# Patient Record
Sex: Female | Born: 1970 | Race: White | Hispanic: No | Marital: Married | Smoking: Former smoker
Health system: Southern US, Community
[De-identification: ages and names within clinical notes are randomized; demographics above are authoritative.]

## PROBLEM LIST (undated history)

## (undated) DIAGNOSIS — R112 Nausea with vomiting, unspecified: Secondary | ICD-10-CM

## (undated) DIAGNOSIS — Z87442 Personal history of urinary calculi: Secondary | ICD-10-CM

## (undated) DIAGNOSIS — Z9889 Other specified postprocedural states: Secondary | ICD-10-CM

## (undated) DIAGNOSIS — M1990 Unspecified osteoarthritis, unspecified site: Secondary | ICD-10-CM

## (undated) DIAGNOSIS — N200 Calculus of kidney: Secondary | ICD-10-CM

## (undated) DIAGNOSIS — D649 Anemia, unspecified: Secondary | ICD-10-CM

## (undated) DIAGNOSIS — F419 Anxiety disorder, unspecified: Secondary | ICD-10-CM

## (undated) DIAGNOSIS — G4730 Sleep apnea, unspecified: Secondary | ICD-10-CM

## (undated) HISTORY — DX: Sleep apnea, unspecified: G47.30

---

## 2001-04-10 ENCOUNTER — Other Ambulatory Visit: Admission: RE | Admit: 2001-04-10 | Discharge: 2001-04-10 | Payer: Self-pay | Admitting: Obstetrics and Gynecology

## 2002-05-14 ENCOUNTER — Other Ambulatory Visit: Admission: RE | Admit: 2002-05-14 | Discharge: 2002-05-14 | Payer: Self-pay | Admitting: Obstetrics and Gynecology

## 2003-05-26 ENCOUNTER — Other Ambulatory Visit: Admission: RE | Admit: 2003-05-26 | Discharge: 2003-05-26 | Payer: Self-pay | Admitting: Obstetrics and Gynecology

## 2004-07-04 DIAGNOSIS — Z8632 Personal history of gestational diabetes: Secondary | ICD-10-CM

## 2004-07-04 HISTORY — DX: Personal history of gestational diabetes: Z86.32

## 2005-05-12 ENCOUNTER — Other Ambulatory Visit: Admission: RE | Admit: 2005-05-12 | Discharge: 2005-05-12 | Payer: Self-pay | Admitting: Obstetrics and Gynecology

## 2005-10-03 ENCOUNTER — Encounter: Admission: RE | Admit: 2005-10-03 | Discharge: 2005-10-03 | Payer: Self-pay | Admitting: Obstetrics and Gynecology

## 2005-11-20 ENCOUNTER — Inpatient Hospital Stay (HOSPITAL_COMMUNITY): Admission: AD | Admit: 2005-11-20 | Discharge: 2005-11-23 | Payer: Self-pay | Admitting: Obstetrics and Gynecology

## 2005-11-20 ENCOUNTER — Encounter (INDEPENDENT_AMBULATORY_CARE_PROVIDER_SITE_OTHER): Payer: Self-pay | Admitting: *Deleted

## 2008-07-04 HISTORY — PX: EXTRACORPOREAL SHOCK WAVE LITHOTRIPSY: SHX1557

## 2008-10-18 ENCOUNTER — Emergency Department (HOSPITAL_COMMUNITY): Admission: EM | Admit: 2008-10-18 | Discharge: 2008-10-18 | Payer: Self-pay | Admitting: Emergency Medicine

## 2008-11-01 ENCOUNTER — Emergency Department (HOSPITAL_COMMUNITY): Admission: EM | Admit: 2008-11-01 | Discharge: 2008-11-01 | Payer: Self-pay | Admitting: Emergency Medicine

## 2008-11-03 ENCOUNTER — Inpatient Hospital Stay (HOSPITAL_COMMUNITY): Admission: AD | Admit: 2008-11-03 | Discharge: 2008-11-04 | Payer: Self-pay | Admitting: Urology

## 2008-11-04 HISTORY — PX: OTHER SURGICAL HISTORY: SHX169

## 2008-11-17 ENCOUNTER — Ambulatory Visit (HOSPITAL_COMMUNITY): Admission: RE | Admit: 2008-11-17 | Discharge: 2008-11-17 | Payer: Self-pay | Admitting: Urology

## 2009-02-04 ENCOUNTER — Encounter (HOSPITAL_COMMUNITY): Admission: RE | Admit: 2009-02-04 | Discharge: 2009-04-07 | Payer: Self-pay | Admitting: Internal Medicine

## 2009-02-26 ENCOUNTER — Ambulatory Visit (HOSPITAL_COMMUNITY): Admission: RE | Admit: 2009-02-26 | Discharge: 2009-02-27 | Payer: Self-pay | Admitting: Surgery

## 2009-02-26 ENCOUNTER — Encounter (INDEPENDENT_AMBULATORY_CARE_PROVIDER_SITE_OTHER): Payer: Self-pay | Admitting: Surgery

## 2009-02-26 HISTORY — PX: OTHER SURGICAL HISTORY: SHX169

## 2010-10-09 LAB — BASIC METABOLIC PANEL
CO2: 28 mEq/L (ref 19–32)
Calcium: 10.4 mg/dL (ref 8.4–10.5)
Chloride: 105 mEq/L (ref 96–112)
GFR calc Af Amer: >60 mL/min (ref >60)
Glucose, Bld: 104 mg/dL — ABNORMAL HIGH (ref 70–99)
Potassium: 4.6 mEq/L (ref 3.5–5.1)
Sodium: 140 mEq/L (ref 135–145)

## 2010-10-09 LAB — PROTIME-INR
INR: 1 (ref 0.00–1.49)
Prothrombin Time: 12.9 seconds (ref 11.6–15.2)

## 2010-10-09 LAB — URINALYSIS, ROUTINE W REFLEX MICROSCOPIC
Hgb urine dipstick: NEGATIVE
Ketones, ur: NEGATIVE mg/dL
Protein, ur: NEGATIVE mg/dL
Urobilinogen, UA: 0.2 mg/dL (ref 0.0–1.0)

## 2010-10-09 LAB — DIFFERENTIAL
Eosinophils Absolute: 0.1 10*3/uL (ref 0.0–0.7)
Lymphocytes Relative: 22 % (ref 12–46)
Lymphs Abs: 1.9 10*3/uL (ref 0.7–4.0)
Neutrophils Relative %: 71 % (ref 43–77)

## 2010-10-09 LAB — CBC
MCV: 90.1 fL (ref 78.0–100.0)
Platelets: 222 10*3/uL (ref 150–400)
RBC: 4.6 MIL/uL (ref 3.87–5.11)

## 2010-10-09 LAB — CALCIUM: Calcium: 9.2 mg/dL (ref 8.4–10.5)

## 2010-10-12 LAB — BASIC METABOLIC PANEL
BUN: 8 mg/dL (ref 6–23)
Calcium: 10.4 mg/dL (ref 8.4–10.5)
Creatinine, Ser: 0.81 mg/dL (ref 0.4–1.2)
GFR calc Af Amer: >60 mL/min (ref >60)

## 2010-10-12 LAB — URINALYSIS, ROUTINE W REFLEX MICROSCOPIC
Glucose, UA: NEGATIVE mg/dL
Hgb urine dipstick: NEGATIVE
Protein, ur: NEGATIVE mg/dL
Specific Gravity, Urine: 1.02 (ref 1.005–1.030)
pH: 6 (ref 5.0–8.0)

## 2010-10-12 LAB — CBC
Platelets: 299 10*3/uL (ref 150–400)
RBC: 4.43 MIL/uL (ref 3.87–5.11)
WBC: 9.4 10*3/uL (ref 4.0–10.5)

## 2010-10-12 LAB — PREGNANCY, URINE: Preg Test, Ur: NEGATIVE

## 2010-10-12 LAB — URINE MICROSCOPIC-ADD ON

## 2010-10-13 LAB — POCT PREGNANCY, URINE: Preg Test, Ur: NEGATIVE

## 2010-10-13 LAB — POCT URINALYSIS DIP (DEVICE)
Glucose, UA: NEGATIVE mg/dL
Ketones, ur: NEGATIVE mg/dL
Specific Gravity, Urine: 1.015 (ref 1.005–1.030)
Urobilinogen, UA: 0.2 mg/dL (ref 0.0–1.0)

## 2010-11-16 NOTE — Op Note (Signed)
[name], [name]            ACCOUNT NO.:  [id number]   MEDICAL RECORD NO.:  [id number]          PATIENT TYPE:  AMB   LOCATION:  DAY                          FACILITY:  [facility]   PHYSICIAN:  [name], MD      DATE OF BIRTH:  1971-05-02   DATE OF PROCEDURE:  02/26/2009  DATE OF DISCHARGE:                               OPERATIVE REPORT   PREOPERATIVE DIAGNOSIS:  Primary hyperparathyroidism.   POSTOPERATIVE DIAGNOSIS:  Primary hyperparathyroidism.   PROCEDURE:  Right superior parathyroidectomy.   SURGEON:  [name], MD, FACS   ASSISTANT:  Anselm Pancoast. Zachery Dakins, MD, FACS   ANESTHESIA:  General.   ESTIMATED BLOOD LOSS:  Minimal.   PREPARATION:  ChloraPrep.   COMPLICATIONS:  None.   INDICATIONS:  The patient is a 40 year old white female from [place],  [place].  She has undergone workup by Dr. [name] for  primary hyperparathyroidism.  She has a history of nephrolithiasis  treated by Dr. [name].  The patient had serum calcium levels  ranging from 10.9 to 11.3.  Intact PTH level was elevated at 74.  A 24-  hour urine collection for calcium was elevated at 368.  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  The patient now comes to surgery  for resection.   BODY OF REPORT:  Procedure was done in OR #11 at the [facility].  The patient was brought to the operating room,  placed in supine position on the operating room table.  Following  administration of general anesthesia, the patient is positioned and then  prepped and draped in the usual strict aseptic fashion.  After  ascertaining that an adequate level of anesthesia had been achieved, an  inferior right neck incision was made with a #15 blade.  Dissection was  carried down through subcutaneous tissues and platysma.  Hemostasis was  obtained with electrocautery.  Subplatysmal flaps were developed  circumferentially.  Weitlaner retractor was placed for exposure.   Strap  muscles were incised in the midline and reflected to the right.  Inferior pole of the thyroid gland is exposed.  Dissection in the  tracheoesophageal groove reveals an abnormally enlarged parathyroid  gland extending from just above the inferior thyroid artery cephalad.  No inferior gland was identified.  Recurrent nerve was identified and  preserved.  Parathyroid gland is dissected out.  It measures  approximately 2 cm in length.  Vascular tributaries were divided between  small Ligaclips under direct vision.  Gland is gently dissected out and  excised in its entirety.  It is submitted to pathology where Dr. [name] states that it is indeed parathyroid tissue.  It is enlarged,  weighing 1000 mg.   Good hemostasis was noted in the neck.  Neck is irrigated.  Surgicel was  placed in the operative field.  Strap muscles were reapproximated in the  midline with interrupted 3-0 Vicryl sutures.  Platysma was closed with  interrupted 3-0 Vicryl sutures.  Local field block was placed with  Marcaine.  Skin was closed with a running 4-0 Monocryl subcuticular  suture.  Wound is washed and dried and Benzoin and Steri-Strips were  applied.  Sterile dressings were applied.  The patient is awakened from  anesthesia and brought to the recovery room.  The patient tolerated the  procedure well.      [name], MD  Electronically Signed     TMG/MEDQ  D:  02/26/2009  T:  02/26/2009  Job:  [id number]   cc:   Tonita Cong, M.D.   Sigmund I. Patsi Sears, M.D.  Fax: [phone]   Sedonia Small, NP  [facility], MD  [phone] [address] [place]  [place] [address]

## 2010-11-16 NOTE — Op Note (Signed)
Emily Berg, Emily Berg            ACCOUNT NO.:  [id number]   MEDICAL RECORD NO.:  [id number]          PATIENT TYPE:  INP   LOCATION:  1408                         FACILITY:  [facility]   PHYSICIAN:  Sigmund I. Patsi Sears, M.D.DATE OF BIRTH:  May 05, 1971   DATE OF PROCEDURE:  11/04/2008  DATE OF DISCHARGE:                               OPERATIVE REPORT   PREOPERATIVE DIAGNOSIS:  Left renal pelvic stone.   POSTOPERATIVE DIAGNOSIS:  Left renal pelvic stone.   OPERATION:  Cystourethroscopy, left retrograde pyelogram, left double-J  stent (5-French x 24 cm).   ANESTHESIA:  General LMA.   PREPARATION:  After appropriate preanesthesia, the patient was brought  to the operating room and placed on the operating room in the dorsal  supine position where general LMA anesthesia was induced.  She was then  replaced in the dorsal lithotomy position where the pubis was prepped  with Betadine solution and draped in the usual fashion.   REVIEW OF HISTORY:  This 40 year old female was admitted yesterday from  the office with one week of left flank pain, left lower quadrant pain,  nausea, and vomiting.  CT scan showed a 7-mm left UPJ stone.  She has  had repeat CT showing stone at the UPJ.  She has had recurrent nausea  and vomiting this morning and is for stent placement.   DESCRIPTION OF PROCEDURE:  Cystourethroscopy was accomplished, left  retrograde pyelogram was accomplished, and left double-J catheter was  placed without difficulty.  The patient was given IV Toradol, awakened,  and taken to the recovery room in good condition.      Sigmund I. Patsi Sears, M.D.  Electronically Signed     SIT/MEDQ  D:  11/04/2008  T:  11/04/2008  Job:  [id number]

## 2010-11-18 ENCOUNTER — Emergency Department (HOSPITAL_COMMUNITY)
Admission: EM | Admit: 2010-11-18 | Discharge: 2010-11-18 | Disposition: A | Discharge status: Home / Self Care | Payer: BC Managed Care – PPO | Attending: Emergency Medicine | Admitting: Emergency Medicine

## 2010-11-18 DIAGNOSIS — F411 Generalized anxiety disorder: Secondary | ICD-10-CM | POA: Insufficient documentation

## 2010-11-18 DIAGNOSIS — F429 Obsessive-compulsive disorder, unspecified: Secondary | ICD-10-CM | POA: Insufficient documentation

## 2010-11-18 DIAGNOSIS — R112 Nausea with vomiting, unspecified: Secondary | ICD-10-CM | POA: Insufficient documentation

## 2010-11-18 DIAGNOSIS — Z79899 Other long term (current) drug therapy: Secondary | ICD-10-CM | POA: Insufficient documentation

## 2010-11-18 DIAGNOSIS — R42 Dizziness and giddiness: Secondary | ICD-10-CM | POA: Insufficient documentation

## 2010-11-19 LAB — POCT I-STAT, CHEM 8
BUN: 9 mg/dL (ref 6–23)
Creatinine, Ser: 1 mg/dL (ref 0.4–1.2)
Hemoglobin: 16 g/dL — ABNORMAL HIGH (ref 12.0–15.0)
Potassium: 3.8 mEq/L (ref 3.5–5.1)
Sodium: 141 mEq/L (ref 135–145)
TCO2: 25 mmol/L (ref 0–100)

## 2010-11-19 NOTE — Op Note (Signed)
[name], Emily Berg            ACCOUNT NO.:  [id number]   MEDICAL RECORD NO.:  [id number]          PATIENT TYPE:  INP   LOCATION:  9108                          FACILITY:  [facility]   PHYSICIAN:  Carrington Clamp, M.D. DATE OF BIRTH:  [birth year]   DATE OF PROCEDURE:  11/20/2005  DATE OF DISCHARGE:                                 OPERATIVE REPORT   PREOPERATIVE DIAGNOSES:  1.  Bleeding.  2.  Non-reassuring fetal heart tones.   POSTOPERATIVE DIAGNOSES:  1.  Probable abruption.  2.  Breech presentation.   PROCEDURE:  Cesarean section with T incision, essentially classical  extension.   SURGEON:  Carrington Clamp, M.D.   ASSISTANT:  None.   ANESTHESIA:  Spinal.   SPECIMENS:  Placenta.   ESTIMATED BLOOD LOSS:  500.   INTRAVENOUS FLUIDS:  2400   URINE OUTPUT:  250.   COMPLICATIONS:  Baby in the footling breech presentation and a tight band of  uterus in the lower uterine segment.  The baby was unable to be delivered  through a transverse incision, even though it was extended bilaterally.  The  incision had to T'd, therefore a classical incision was made on the uterus  as well.   FINDINGS:  A female infant, footling breech, Apgar scores 7 and 9.  Tight  lower uterine segment band and underdeveloped lower uterine segment.  Normal  tubes and ovaries were otherwise seen.   MEDICATIONS:  Pitocin and Ancef.   COUNTS:  Correct x3.   REASON FOR OPERATION:  Emily Berg complained of rupture of membranes at  1:55, clear fluid this evening and was instructed to come to the hospital.  On arrival at the hospital, she had gone to the bathroom and told the ladies  up front afterwards that she had a significant amount of bleeding.  She was  taken directly to the Maternity Admissions Unit, undressed and baby was  placed on the monitor.  There was a significant amount of bleeding coming  from the vagina mixed with fluid.  The baby was tachycardiac with 3, what  appeared to be late  decelerations down to the 60s for about a minute.  I was  called and arrived at the hospital within 5-6 minutes.  I evaluated the  strip and checked the patient's cervix, which was 2, long and high.  There  was a significant amount of bloody fluid coming from the vagina.  The  decision was made to do an emergent cesarean section secondary to probable  abruption.  The patient was counseled of the risks, benefits and  alternatives and taken to the operating room in a very timely fashion.   TECHNIQUE:  Once spinal anesthesia was achieved, the patient was prepped and  draped in the usual sterile fashion in the dorsal supine position with a  leftward tilt.  A Pfannenstiel skin incision was made with a scalpel and  carried down to fascia with the Bovie cautery.  The fascia was incised in  the midline with a scalpel and extended in a transverse curvilinear manner  with Mayo scissors.  The fascia was reflected superiorly and  inferiorly from  the rectus muscles and the rectus muscles split in the midline.  The  peritoneum was entered into bluntly and the bladder blade placed.  The  vesicouterine fascia was tented up and incised in a transverse curvilinear  manner and the bladder flap created bluntly.  The bladder blade was replaced  and a 2-cm incision was made transversely in the lower uterine segment.  This was then extended with the bandage scissors and the placenta was noted  to be anterior.   Once inside the amniotic cavity, the baby was identified in the footling  breech presentation and the feet were able to be grasped and pulled through  the incision.  However, the incision proved to be not large enough for the  buttocks to be able to be delivered and the incision was extended  transversely.  Again, there was still not enough room, as there was no  development of the lower uterine segment and there was a tight uterine band  just above the incision.  The Mayo scissors were then used to T  the incision  in a vertical fashion.  The cut was made about 6-7 cm.  The baby was then  able to be delivered in the usual fashion without complication.  The cord  was clamped and cut and the baby was handed to waiting Pediatrics.   The placenta was then delivered and it appeared that it was a possible  abruption secondary to some clot on the surface of the placenta, as well as  the fact that it was not very adherent.  An attempt was made to obtain the  cord gas; however, insufficient blood was able to be obtained.  The  transverse incision in the uterus was then closed with a running locked lock  stitch of 0 Monocryl.  There was a bleeding artery in the right leaf of the  broad ligament, just lateral to the upper portion of the lower uterine  segment.  This could not be closed with the suture that was closing the  incision and therefore a mattress-like suture was performed through the  right side of the cervix and back through the broad ligament close to the  cervix and tied down.  This was well-away from the ureter, as this was at  the level of the internal os and medial to those structures.   The vertical incision was then closed with a running lock stitch of 0  Monocryl.  Two additional imbricating layers of 0 Monocryl were performed to  the vertical incision and 1 imbricating layer for the horizontal incision.  Several figure-of-eight stitches were used to ensure hemostasis.  Once  hemostasis was achieved, the uterus was reapproximated in the abdomen and  the abdomen cleared of all debris with irrigation.  The uterine incisions  were then reinspected and found to be hemostatic.   The peritoneum was then closed with a running stitch of 2-0 Vicryl.  The  fascia was closed with a running stitch of 0 Vicryl.  The subcutaneous  tissue were rendered hemostatic with the Bovie cautery and irrigation.  The subcutaneous tissue was closed with 3 interrupted stitches of 2-0 plain gut.  The  skin was closed with staples.  The patient was  instructed on the operating table both during the surgery and immediately  afterwards that she had essentially a classical cesarean section scar and  would not be able to labor ever again.  The patient understood and was  returned to recovery  room in stable condition.      Carrington Clamp, M.D.  Electronically Signed     MH/MEDQ  D:  11/20/2005  T:  11/21/2005  Job:  [id number]

## 2010-11-19 NOTE — Discharge Summary (Signed)
Emily Berg, Emily Berg            ACCOUNT NO.:  [id number]   MEDICAL RECORD NO.:  [id number]          PATIENT TYPE:  INP   LOCATION:  9108                          FACILITY:  [facility]   PHYSICIAN:  Gerrit Friends. Aldona Bar, M.D.   DATE OF BIRTH:  [birth year]   DATE OF ADMISSION:  11/20/2005  DATE OF DISCHARGE:  11/23/2005                                 DISCHARGE SUMMARY   DISCHARGE DIAGNOSES:  1.  Thirty-six week intrauterine pregnancy, delivered of a 5 pound female      infant, Apgars 7/9.  2.  Blood type O positive.  3.  Suspected abruption of placenta.  4.  Cesarean section - classical extension of a low transverse cesarean      section.   SUMMARY:  This 40 year old gravida 3, para 1 was admitted at [redacted] weeks  gestation with bleeding, contractions, and on the monitor was having late  decelerations of the fetal heart rate.  Abruption was suspected, and she was  taken to the operating room for emergency cesarean section.  Intraoperative,  the Pfannenstiel incision was extended superiorly in an inverted T fashion -  almost like a classical cesarean section, and she was delivered of a 5 pound  female infant with good Apgars.  The postoperative course was benign.  Discharge hemoglobin was 11.4 with a white count of 13,300 and a platelet  count of 241,000.  On the morning of Nov 23, 2005, she was ambulating well,  tolerating a regular diet well, had normal bowel and bladder function, was  bottle feeding without difficulty.  Her incision was clean and dry, and her  vital signs were stable, and she was deemed ready for discharge.  Accordingly, all her staples were removed, and her wound was Steri-stripped  appropriately.  She was given all appropriate instructions and understood  all instructions well.   DISCHARGE MEDICATIONS:  1.  Vitamins - one a day.  She will finish them up, as she is bottle      feeding.  2.  She will use Feosol capsules, one 2-3 times a week.  3.  She was given a prescription for  Motrin 600 mg and for Tylox to use      respectively for pain as needed.   FOLLOW UP:  She will return to the office for follow up in approximately 2  weeks to see Dr. Henderson Cloud.  She understood all instructions well at the time  of discharge.   CONDITION ON DISCHARGE:  Improved.      Gerrit Friends. Aldona Bar, M.D.  Electronically Signed     RMW/MEDQ  D:  11/23/2005  T:  11/23/2005  Job:  [id number]

## 2013-07-30 ENCOUNTER — Other Ambulatory Visit: Payer: Self-pay | Admitting: Obstetrics and Gynecology

## 2013-07-30 DIAGNOSIS — N6321 Unspecified lump in the left breast, upper outer quadrant: Secondary | ICD-10-CM

## 2013-08-02 ENCOUNTER — Ambulatory Visit
Admission: RE | Admit: 2013-08-02 | Discharge: 2013-08-02 | Disposition: A | Discharge status: Home / Self Care | Payer: Self-pay | Source: Ambulatory Visit | Attending: Obstetrics and Gynecology | Admitting: Obstetrics and Gynecology

## 2013-08-02 ENCOUNTER — Other Ambulatory Visit: Payer: Self-pay | Admitting: Obstetrics and Gynecology

## 2013-08-02 ENCOUNTER — Ambulatory Visit
Admission: RE | Admit: 2013-08-02 | Discharge: 2013-08-02 | Disposition: A | Discharge status: Home / Self Care | Payer: BC Managed Care – PPO | Source: Ambulatory Visit | Attending: Obstetrics and Gynecology | Admitting: Obstetrics and Gynecology

## 2013-08-02 DIAGNOSIS — N6321 Unspecified lump in the left breast, upper outer quadrant: Secondary | ICD-10-CM

## 2013-08-02 DIAGNOSIS — N63 Unspecified lump in unspecified breast: Secondary | ICD-10-CM

## 2014-02-11 ENCOUNTER — Other Ambulatory Visit: Payer: Self-pay | Admitting: Obstetrics and Gynecology

## 2014-02-11 DIAGNOSIS — N6320 Unspecified lump in the left breast, unspecified quadrant: Secondary | ICD-10-CM

## 2014-02-20 ENCOUNTER — Other Ambulatory Visit: Payer: Self-pay | Admitting: Obstetrics and Gynecology

## 2014-02-20 ENCOUNTER — Ambulatory Visit
Admission: RE | Admit: 2014-02-20 | Discharge: 2014-02-20 | Disposition: A | Discharge status: Home / Self Care | Payer: PRIVATE HEALTH INSURANCE | Source: Ambulatory Visit | Attending: Obstetrics and Gynecology | Admitting: Obstetrics and Gynecology

## 2014-02-20 ENCOUNTER — Ambulatory Visit: Payer: PRIVATE HEALTH INSURANCE

## 2014-02-20 DIAGNOSIS — N6320 Unspecified lump in the left breast, unspecified quadrant: Secondary | ICD-10-CM

## 2014-07-10 ENCOUNTER — Ambulatory Visit
Admission: RE | Admit: 2014-07-10 | Discharge: 2014-07-10 | Disposition: A | Discharge status: Home / Self Care | Payer: PRIVATE HEALTH INSURANCE | Source: Ambulatory Visit | Attending: Family | Admitting: Family

## 2014-07-10 ENCOUNTER — Other Ambulatory Visit: Payer: Self-pay | Admitting: Family

## 2014-07-10 DIAGNOSIS — R109 Unspecified abdominal pain: Secondary | ICD-10-CM

## 2014-07-11 ENCOUNTER — Other Ambulatory Visit: Payer: Self-pay | Admitting: Urology

## 2014-07-11 ENCOUNTER — Encounter (HOSPITAL_BASED_OUTPATIENT_CLINIC_OR_DEPARTMENT_OTHER): Payer: Self-pay | Admitting: *Deleted

## 2014-07-14 ENCOUNTER — Encounter (HOSPITAL_BASED_OUTPATIENT_CLINIC_OR_DEPARTMENT_OTHER): Payer: Self-pay | Admitting: *Deleted

## 2014-07-14 ENCOUNTER — Other Ambulatory Visit: Payer: Self-pay | Admitting: Urology

## 2014-07-14 NOTE — Progress Notes (Signed)
NPO AFTER MN. ARRIVE AT 0900. NEEDS HG AND URINE PREG. MAY TAKE HYDROCODONE IF NEEDED AM DOS W/ SIPS OF WATER.

## 2014-07-15 ENCOUNTER — Encounter (HOSPITAL_COMMUNITY): Payer: Self-pay | Admitting: *Deleted

## 2014-07-15 NOTE — H&P (Signed)
History of Present Illness   Emily Berg presents today as a more urgent walk-in with a newly diagnosed right renal pelvic stone. Tia is currently 44 years of age. She required intervention for a left proximal ureteral stone back in 2010. She was acutely ill and required urgent stent placement secondary to ongoing pain and nausea followed by lithotripsy. That procedure worked well. At that time, she was known to have a 3 mm stone in her right renal unit. She was diagnosed with hypothyroidism and underwent a resection of a parathyroid adenoma by Dr Armandina Gemma. Followup studies indicated that her calcium level normalized, but she not had a reassessment in quite some time. Clinically, she has done well but starting approximately 6 weeks ago, she began experiencing some intermittent right-sided flank/back discomfort. She recently underwent CT imaging of her abdomen and pelvis without contrast. This demonstrated a 12 mm x 15 mm stone in her right renal pelvis. There did not appear to be evidence of any high grade obstruction. There was also a very tiny 1 mm stone in the lower pole calyx. No left-sided renal calculi were noted. She has had some intermittent ongoing mild discomfort. She has been given a course of ciprofloxacin and also some hydrocodone. Her urinalysis today shows a small degree of pyuria. There is no significant hematuria today. I have reviewed her imaging studies. I have reviewed her previous records. Previous stones analysis was a mixture of calcium oxalate and calcium phosphate. It did appear that her stone fractured well with previous lithotripsy.      Past Medical History Problems  1. History of anemia (Z86.2) 2. History of esophageal reflux (Z87.19)  Surgical History Problems  1. History of Cesarean Section 2. History of Cystoscopy With Insertion Of Ureteral Stent Left 3. History of Lithotripsy  Current Meds 1. Cipro 500 MG Oral Tablet;  Therapy: (Recorded:08Jan2016) to  Recorded 2. Hydrocodone-Acetaminophen 5-325 MG Oral Tablet;  Therapy: (Recorded:08Jan2016) to Recorded 3. Junel Fe 24 TABS;  Therapy: (Recorded:08Jan2016) to Recorded  Allergies Medication  1. No Known Drug Allergies  Family History Problems  1. Family history of Diabetes Mellitus 2. Family history of Family Health Status - Father's Age   65 3. Family history of Family Health Status - Mother's Age   11 4. Family history of Family Health Status Number Of Children   1 son, 1 daughter 5. Family history of Nephrolithiasis : Father  Social History Problems    Denied: History of Alcohol Use   Caffeine Use   4 per day   Former Smoker   2 ppd x 13yrs, quit 60yrs ago   Marital History - Currently Married  Review of Systems Genitourinary, constitutional, skin, eye, otolaryngeal, hematologic/lymphatic, cardiovascular, pulmonary, endocrine, musculoskeletal, gastrointestinal, neurological and psychiatric system(s) were reviewed and pertinent findings if present are noted and are otherwise negative.  Gastrointestinal: nausea and flank pain, but no abdominal pain.    Vitals Vital Signs [Data Includes: Last 1 Day]  Recorded: [id number] 12:59PM  Height: 5 ft 6 in Weight: 214 lb  BMI Calculated: 34.54 BSA Calculated: 2.06 Blood Pressure: 138 / 89 Temperature: 99.3 F Heart Rate: 81  Physical Exam Constitutional: Well nourished and well developed . No acute distress.  ENT:. The ears and nose are normal in appearance.  Neck: The appearance of the neck is normal and no neck mass is present.  Pulmonary: No respiratory distress and normal respiratory rhythm and effort.  Cardiovascular: Heart rate and rhythm are normal . No peripheral edema.  Abdomen:  The abdomen is soft and nontender. No masses are palpated. No CVA tenderness. No hernias are palpable. No hepatosplenomegaly noted.    Results/Data Urine [Data Includes: Last 1 Day]   [id number]  COLOR YELLOW   APPEARANCE CLEAR    SPECIFIC GRAVITY 1.025   pH 5.5   GLUCOSE 250 mg/dL  BILIRUBIN NEG   KETONE NEG mg/dL  BLOOD TRACE   PROTEIN NEG mg/dL  UROBILINOGEN 0.2 mg/dL  NITRITE NEG   LEUKOCYTE ESTERASE SMALL   SQUAMOUS EPITHELIAL/HPF MODERATE   WBC 11-20 WBC/hpf  RBC 0-2 RBC/hpf  BACTERIA FEW   CRYSTALS NONE SEEN   CASTS NONE SEEN    Assessment Assessed  1. Nephrolithiasis (N20.0) 2. Pyuria (N39.0)  Plan Health Maintenance  1. UA With REFLEX; [Do Not Release]; Status:Complete;   Done: [id number] 12:32PM Nephrolithiasis  2. BASIC METABOLIC PANEL; Status:Hold For - Specimen/Data Collection,Appointment;  Requested EUM:[id number];  3. PTH INTACT with CALCIUM; Status:Hold For - Specimen/Data Collection,Appointment;  Requested VQM:[id number];  4. Follow-up Schedule Surgery Office  Follow-up  Status: Hold For - Appointment   Requested for: [id number]  Discussion/Summary   Shanaiya has a fairly large 15 mm fairly large stone in her right renal pelvis. It is currently not causing a significant degree of obstruction, but undoubtedly has been ball valving in and out of her ureteropelvic junction causing intermittent pain. Her urinalysis does show some pyuria, which I suspect is secondary to inflammation rather than infection. She did not have obvious pyelonephritis. I have asked her to reduce her ciprofloxacin dose to 250 twice a day while we are awaiting her culture results. This stone can be treated a variety of ways. Clearly, intervention is indicated. Percutaneous nephrolithotomy could be performed, but I think that would over aggressive therapy. My only concern with ESWL is the ability to pass all of those fragments. We talked about several options and have decided to proceed with double-J stent placement to reduce the risk of obstructing fragments followed by ESWL. The stent should allow for better ureteral dilation and an improved chance of passing the stone pieces spontaneously. She understands there is a  chance she will require multiple lithotripsy's or ureteroscopy to remove all of the fragments. We will try to set up something for the next week or so. She does have adequate pain medication at this time.   cc: Sadie Haber and [facility]   Signatures Electronically signed by : Rana Snare, M.D.; Jul 11 2014  4:04PM EST

## 2014-07-16 ENCOUNTER — Ambulatory Visit (HOSPITAL_BASED_OUTPATIENT_CLINIC_OR_DEPARTMENT_OTHER): Payer: PRIVATE HEALTH INSURANCE | Admitting: Anesthesiology

## 2014-07-16 ENCOUNTER — Encounter (HOSPITAL_BASED_OUTPATIENT_CLINIC_OR_DEPARTMENT_OTHER): Payer: Self-pay | Admitting: Anesthesiology

## 2014-07-16 ENCOUNTER — Encounter (HOSPITAL_BASED_OUTPATIENT_CLINIC_OR_DEPARTMENT_OTHER)
Admission: RE | Disposition: A | Discharge status: Home / Self Care | Payer: Self-pay | Source: Ambulatory Visit | Attending: Urology

## 2014-07-16 ENCOUNTER — Ambulatory Visit (HOSPITAL_BASED_OUTPATIENT_CLINIC_OR_DEPARTMENT_OTHER)
Admission: RE | Admit: 2014-07-16 | Discharge: 2014-07-16 | Disposition: A | Discharge status: Home / Self Care | Payer: PRIVATE HEALTH INSURANCE | Source: Ambulatory Visit | Attending: Urology | Admitting: Urology

## 2014-07-16 DIAGNOSIS — Z87891 Personal history of nicotine dependence: Secondary | ICD-10-CM | POA: Diagnosis not present

## 2014-07-16 DIAGNOSIS — E039 Hypothyroidism, unspecified: Secondary | ICD-10-CM | POA: Insufficient documentation

## 2014-07-16 DIAGNOSIS — D649 Anemia, unspecified: Secondary | ICD-10-CM | POA: Diagnosis not present

## 2014-07-16 DIAGNOSIS — N200 Calculus of kidney: Secondary | ICD-10-CM | POA: Diagnosis not present

## 2014-07-16 DIAGNOSIS — K219 Gastro-esophageal reflux disease without esophagitis: Secondary | ICD-10-CM | POA: Insufficient documentation

## 2014-07-16 DIAGNOSIS — D351 Benign neoplasm of parathyroid gland: Secondary | ICD-10-CM | POA: Diagnosis not present

## 2014-07-16 DIAGNOSIS — N390 Urinary tract infection, site not specified: Secondary | ICD-10-CM | POA: Insufficient documentation

## 2014-07-16 HISTORY — PX: CYSTOSCOPY W/ URETERAL STENT PLACEMENT: SHX1429

## 2014-07-16 HISTORY — DX: Nausea with vomiting, unspecified: R11.2

## 2014-07-16 HISTORY — DX: Personal history of urinary calculi: Z87.442

## 2014-07-16 HISTORY — DX: Other specified postprocedural states: Z98.890

## 2014-07-16 HISTORY — DX: Calculus of kidney: N20.0

## 2014-07-16 LAB — POCT HEMOGLOBIN-HEMACUE: HEMOGLOBIN: 14.3 g/dL (ref 12.0–15.0)

## 2014-07-16 LAB — POCT PREGNANCY, URINE: PREG TEST UR: NEGATIVE

## 2014-07-16 SURGERY — CYSTOSCOPY, WITH RETROGRADE PYELOGRAM AND URETERAL STENT INSERTION
Anesthesia: General | Site: Ureter | Laterality: Right

## 2014-07-16 MED ORDER — SODIUM CHLORIDE 0.9 % IR SOLN
Status: DC | PRN
  Administered 2014-07-16: 4000 mL via INTRAVESICAL

## 2014-07-16 MED ORDER — LIDOCAINE HCL 2 % EX GEL
CUTANEOUS | Status: DC | PRN
  Administered 2014-07-16: 1 via URETHRAL

## 2014-07-16 MED ORDER — SCOPOLAMINE 1 MG/3DAYS TD PT72
MEDICATED_PATCH | TRANSDERMAL | Status: AC
  Filled 2014-07-16: qty 1

## 2014-07-16 MED ORDER — SCOPOLAMINE 1 MG/3DAYS TD PT72
MEDICATED_PATCH | TRANSDERMAL | Status: DC | PRN
  Administered 2014-07-16: 1 via TRANSDERMAL

## 2014-07-16 MED ORDER — URIBEL 118 MG PO CAPS: 1.0000 | ORAL_CAPSULE | Freq: Three times a day (TID) | ORAL | Status: DC | PRN

## 2014-07-16 MED ORDER — LACTATED RINGERS IV SOLN
INTRAVENOUS | Status: DC
  Administered 2014-07-16: 10:00:00 via INTRAVENOUS
  Filled 2014-07-16: qty 1000

## 2014-07-16 MED ORDER — CEFAZOLIN SODIUM-DEXTROSE 2-3 GM-% IV SOLR
INTRAVENOUS | Status: AC
  Filled 2014-07-16: qty 50

## 2014-07-16 MED ORDER — FENTANYL CITRATE 0.05 MG/ML IJ SOLN
25.0000 ug | INTRAMUSCULAR | Status: DC | PRN
  Filled 2014-07-16: qty 1

## 2014-07-16 MED ORDER — CEFAZOLIN SODIUM 1-5 GM-% IV SOLN
1.0000 g | INTRAVENOUS | Status: DC
  Filled 2014-07-16: qty 50

## 2014-07-16 MED ORDER — CEFAZOLIN SODIUM-DEXTROSE 2-3 GM-% IV SOLR
2.0000 g | INTRAVENOUS | Status: AC
  Administered 2014-07-16: 2 g via INTRAVENOUS
  Filled 2014-07-16: qty 50

## 2014-07-16 MED ORDER — ACETAMINOPHEN 10 MG/ML IV SOLN
INTRAVENOUS | Status: DC | PRN
  Administered 2014-07-16: 1000 mg via INTRAVENOUS

## 2014-07-16 MED ORDER — MIDAZOLAM HCL 2 MG/2ML IJ SOLN
INTRAMUSCULAR | Status: AC
  Filled 2014-07-16: qty 2

## 2014-07-16 MED ORDER — LACTATED RINGERS IV SOLN
INTRAVENOUS | Status: DC
  Filled 2014-07-16: qty 1000

## 2014-07-16 MED ORDER — PROPOFOL 10 MG/ML IV BOLUS
INTRAVENOUS | Status: DC | PRN
  Administered 2014-07-16: 200 mg via INTRAVENOUS

## 2014-07-16 MED ORDER — DEXAMETHASONE SODIUM PHOSPHATE 4 MG/ML IJ SOLN
INTRAMUSCULAR | Status: DC | PRN
  Administered 2014-07-16: 10 mg via INTRAVENOUS

## 2014-07-16 MED ORDER — LIDOCAINE HCL (CARDIAC) 20 MG/ML IV SOLN
INTRAVENOUS | Status: DC | PRN
  Administered 2014-07-16: 60 mg via INTRAVENOUS

## 2014-07-16 MED ORDER — IOHEXOL 350 MG/ML SOLN
INTRAVENOUS | Status: DC | PRN
  Administered 2014-07-16: 4 mL

## 2014-07-16 MED ORDER — FENTANYL CITRATE 0.05 MG/ML IJ SOLN
INTRAMUSCULAR | Status: DC | PRN
  Administered 2014-07-16 (×2): 50 ug via INTRAVENOUS

## 2014-07-16 MED ORDER — ONDANSETRON HCL 4 MG/2ML IJ SOLN
INTRAMUSCULAR | Status: DC | PRN
  Administered 2014-07-16: 4 mg via INTRAVENOUS

## 2014-07-16 MED ORDER — METOCLOPRAMIDE HCL 5 MG/ML IJ SOLN
INTRAMUSCULAR | Status: DC | PRN
  Administered 2014-07-16: 10 mg via INTRAVENOUS

## 2014-07-16 MED ORDER — FENTANYL CITRATE 0.05 MG/ML IJ SOLN
INTRAMUSCULAR | Status: AC
  Filled 2014-07-16: qty 4

## 2014-07-16 MED ORDER — MIDAZOLAM HCL 5 MG/5ML IJ SOLN
INTRAMUSCULAR | Status: DC | PRN
  Administered 2014-07-16: 2 mg via INTRAVENOUS

## 2014-07-16 SURGICAL SUPPLY — 31 items
ADAPTER CATH URET PLST 4-6FR (CATHETERS) IMPLANT
BAG DRAIN URO-CYSTO SKYTR STRL (DRAIN) ×2 IMPLANT
BENZOIN TINCTURE PRP APPL 2/3 (GAUZE/BANDAGES/DRESSINGS) IMPLANT
CANISTER SUCT LVC 12 LTR MEDI- (MISCELLANEOUS) ×2 IMPLANT
CATH INTERMIT  6FR 70CM (CATHETERS) ×2 IMPLANT
CATH URET 5FR 28IN CONE TIP (BALLOONS)
CATH URET 5FR 28IN OPEN ENDED (CATHETERS) IMPLANT
CATH URET 5FR 70CM CONE TIP (BALLOONS) IMPLANT
CLOTH BEACON ORANGE TIMEOUT ST (SAFETY) ×2 IMPLANT
DRAPE CAMERA CLOSED 9X96 (DRAPES) ×2 IMPLANT
DRSG TEGADERM 2-3/8X2-3/4 SM (GAUZE/BANDAGES/DRESSINGS) IMPLANT
GLOVE BIO SURGEON STRL SZ7.5 (GLOVE) ×2 IMPLANT
GLOVE BIOGEL M 6.5 STRL (GLOVE) ×2 IMPLANT
GLOVE BIOGEL PI IND STRL 6.5 (GLOVE) ×2 IMPLANT
GLOVE BIOGEL PI INDICATOR 6.5 (GLOVE) ×2
GOWN STRL REIN XL XLG (GOWN DISPOSABLE) IMPLANT
GOWN STRL REUS W/TWL LRG LVL3 (GOWN DISPOSABLE) ×2 IMPLANT
GOWN STRL REUS W/TWL XL LVL3 (GOWN DISPOSABLE) ×2 IMPLANT
GUIDEWIRE 0.038 PTFE COATED (WIRE) IMPLANT
GUIDEWIRE ANG ZIPWIRE 038X150 (WIRE) IMPLANT
GUIDEWIRE STR DUAL SENSOR (WIRE) ×2 IMPLANT
IV NS 1000ML (IV SOLUTION) ×1
IV NS 1000ML BAXH (IV SOLUTION) ×1 IMPLANT
IV NS IRRIG 3000ML ARTHROMATIC (IV SOLUTION) ×4 IMPLANT
KIT BALLIN UROMAX 15FX10 (LABEL) IMPLANT
KIT BALLN UROMAX 15FX4 (MISCELLANEOUS) IMPLANT
KIT BALLN UROMAX 26 75X4 (MISCELLANEOUS)
NS IRRIG 500ML POUR BTL (IV SOLUTION) IMPLANT
PACK CYSTO (CUSTOM PROCEDURE TRAY) ×2 IMPLANT
SET HIGH PRES BAL DIL (LABEL)
STENT URET 6FRX24 CONTOUR (STENTS) ×2 IMPLANT

## 2014-07-16 NOTE — Anesthesia Preprocedure Evaluation (Addendum)
Anesthesia Evaluation  Patient identified by MRN, date of birth, ID band Patient awake    Reviewed: Allergy & Precautions, H&P , NPO status , Patient's Chart, lab work & pertinent test results  History of Anesthesia Complications (+) PONV  Airway Mallampati: II  TM Distance: >3 FB Neck ROM: full    Dental no notable dental hx. (+) Teeth Intact, Dental Advisory Given   Pulmonary neg pulmonary ROS, former smoker,  breath sounds clear to auscultation  Pulmonary exam normal       Cardiovascular Exercise Tolerance: Good negative cardio ROS  Rhythm:regular Rate:Normal     Neuro/Psych negative neurological ROS  negative psych ROS   GI/Hepatic negative GI ROS, Neg liver ROS,   Endo/Other  negative endocrine ROS  Renal/GU negative Renal ROS  negative genitourinary   Musculoskeletal   Abdominal   Peds  Hematology negative hematology ROS (+)   Anesthesia Other Findings   Reproductive/Obstetrics negative OB ROS                            Anesthesia Physical Anesthesia Plan  ASA: I  Anesthesia Plan: General   Post-op Pain Management:    Induction: Intravenous  Airway Management Planned: LMA  Additional Equipment:   Intra-op Plan:   Post-operative Plan:   Informed Consent: I have reviewed the patients History and Physical, chart, labs and discussed the procedure including the risks, benefits and alternatives for the proposed anesthesia with the patient or authorized representative who has indicated his/her understanding and acceptance.   Dental Advisory Given  Plan Discussed with: CRNA and Surgeon  Anesthesia Plan Comments:         Anesthesia Quick Evaluation

## 2014-07-16 NOTE — Anesthesia Postprocedure Evaluation (Signed)
  Anesthesia Post-op Note  Patient: Emily Berg  Procedure(s) Performed: Procedure(s) (LRB): CYSTOSCOPY WITH RETROGRADE PYELOGRAM/URETERAL STENT PLACEMENT (Right)  Patient Location: PACU  Anesthesia Type: General  Level of Consciousness: awake and alert   Airway and Oxygen Therapy: Patient Spontanous Breathing  Post-op Pain: mild  Post-op Assessment: Post-op Vital signs reviewed, Patient's Cardiovascular Status Stable, Respiratory Function Stable, Patent Airway and No signs of Nausea or vomiting  Last Vitals:  Filed Vitals:   07/16/14 1145  BP: 141/91  Pulse: 82  Temp:   Resp: 15    Post-op Vital Signs: stable   Complications: No apparent anesthesia complications

## 2014-07-16 NOTE — Op Note (Signed)
Preoperative diagnosis: 1215 mm right renal pelvic stone  Postoperative diagnosis:Same  Procedure:Cystoscopy, right retrograde pyelogram, right double-J stent placement 6 French 24 cm   Surgeon: Bernestine Amass M.D.  Anesthesia: Gen.  Indications:Konner recently presented with right-sided flank discomfort and CT imaging demonstrated a 12 x 15 mm stone in her right renal pelvis. There did not appear to be high-grade obstruction. We discussed multiple treatment options with her including percutaneous nephrolithotomy, ESWL alone, double-J stent placement followed by ESWL or ureteroscopy. She is chosen have a double-J stent followed by ESWL which is been scheduled for early next week.    Technique and findings:Patient was brought to the operating room where she had successful induction of general anesthesia. She was placed in lithotomy position and prepped and draped in usual manner. Appropriate surgical timeout was performed. Cystoscopy revealed unremarkable urethra and bladder.  Right retrograde pyelogram was done with a known Band-Aid catheter and real-time fluoroscopic interpretation. The entire ureter was normal in appearance. There was a large filling defect in the right renal pelvis without evidence of high-grade obstruction.   Guidewire was placed in the right renal pelvis. A 24 cm 6 French double-J stent was then placed with fluoroscopic and visual guidance. Good positioning confirmed. Patient was brought to PACU in stable condition having had no obvious complications or problems.

## 2014-07-16 NOTE — Interval H&P Note (Signed)
History and Physical Interval Note:  07/16/2014 9:51 AM  Emily Berg  has presented today for surgery, with the diagnosis of RIGHT RENAL CALCULUS  The various methods of treatment have been discussed with the patient and family. After consideration of risks, benefits and other options for treatment, the patient has consented to  Procedure(s): CYSTOSCOPY WITH RETROGRADE PYELOGRAM/URETERAL STENT PLACEMENT (Right) as a surgical intervention .  The patient's history has been reviewed, patient examined, no change in status, stable for surgery.  I have reviewed the patient's chart and labs.  Questions were answered to the patient's satisfaction.     Florentine Diekman S

## 2014-07-16 NOTE — Transfer of Care (Signed)
Immediate Anesthesia Transfer of Care Note  Patient: Emily Berg  Procedure(s) Performed: Procedure(s) (LRB): CYSTOSCOPY WITH RETROGRADE PYELOGRAM/URETERAL STENT PLACEMENT (Right)  Patient Location: PACU  Anesthesia Type: General  Level of Consciousness: awake, alert  and oriented  Airway & Oxygen Therapy: Patient Spontanous Breathing and Patient connected to face mask oxygen  Post-op Assessment: Report given to PACU RN and Post -op Vital signs reviewed and stable  Post vital signs: Reviewed and stable  Complications: No apparent anesthesia complications

## 2014-07-16 NOTE — Anesthesia Procedure Notes (Signed)
Procedure Name: LMA Insertion Date/Time: 07/16/2014 10:51 AM Performed by: Mechele Claude Pre-anesthesia Checklist: Patient identified, Emergency Drugs available, Suction available and Patient being monitored Patient Re-evaluated:Patient Re-evaluated prior to inductionOxygen Delivery Method: Circle System Utilized Preoxygenation: Pre-oxygenation with 100% oxygen Intubation Type: IV induction Ventilation: Mask ventilation without difficulty LMA: LMA inserted LMA Size: 4.0 Number of attempts: 1 Airway Equipment and Method: bite block Placement Confirmation: positive ETCO2 Tube secured with: Tape Dental Injury: Teeth and Oropharynx as per pre-operative assessment

## 2014-07-16 NOTE — Discharge Instructions (Signed)

## 2014-07-17 ENCOUNTER — Encounter (HOSPITAL_BASED_OUTPATIENT_CLINIC_OR_DEPARTMENT_OTHER): Payer: Self-pay | Admitting: Urology

## 2014-07-21 ENCOUNTER — Ambulatory Visit (HOSPITAL_COMMUNITY): Payer: PRIVATE HEALTH INSURANCE

## 2014-07-21 ENCOUNTER — Ambulatory Visit (HOSPITAL_COMMUNITY)
Admission: RE | Admit: 2014-07-21 | Discharge: 2014-07-21 | Disposition: A | Discharge status: Home / Self Care | Payer: PRIVATE HEALTH INSURANCE | Source: Ambulatory Visit | Attending: Urology | Admitting: Urology

## 2014-07-21 ENCOUNTER — Encounter (HOSPITAL_COMMUNITY): Payer: Self-pay | Admitting: *Deleted

## 2014-07-21 ENCOUNTER — Encounter (HOSPITAL_COMMUNITY)
Admission: RE | Disposition: A | Discharge status: Home / Self Care | Payer: Self-pay | Source: Ambulatory Visit | Attending: Urology

## 2014-07-21 DIAGNOSIS — Z9889 Other specified postprocedural states: Secondary | ICD-10-CM | POA: Insufficient documentation

## 2014-07-21 DIAGNOSIS — Z87891 Personal history of nicotine dependence: Secondary | ICD-10-CM | POA: Insufficient documentation

## 2014-07-21 DIAGNOSIS — E039 Hypothyroidism, unspecified: Secondary | ICD-10-CM | POA: Insufficient documentation

## 2014-07-21 DIAGNOSIS — N390 Urinary tract infection, site not specified: Secondary | ICD-10-CM | POA: Diagnosis not present

## 2014-07-21 DIAGNOSIS — K219 Gastro-esophageal reflux disease without esophagitis: Secondary | ICD-10-CM | POA: Insufficient documentation

## 2014-07-21 DIAGNOSIS — N200 Calculus of kidney: Secondary | ICD-10-CM | POA: Insufficient documentation

## 2014-07-21 DIAGNOSIS — Z79899 Other long term (current) drug therapy: Secondary | ICD-10-CM | POA: Diagnosis not present

## 2014-07-21 DIAGNOSIS — Z862 Personal history of diseases of the blood and blood-forming organs and certain disorders involving the immune mechanism: Secondary | ICD-10-CM | POA: Diagnosis not present

## 2014-07-21 HISTORY — DX: Anemia, unspecified: D64.9

## 2014-07-21 SURGERY — LITHOTRIPSY, ESWL
Anesthesia: LOCAL | Laterality: Right

## 2014-07-21 MED ORDER — OXYCODONE-ACETAMINOPHEN 5-325 MG PO TABS: 1.0000 | ORAL_TABLET | Freq: Four times a day (QID) | ORAL | Status: DC | PRN

## 2014-07-21 MED ORDER — DIPHENHYDRAMINE HCL 25 MG PO CAPS
25.0000 mg | ORAL_CAPSULE | ORAL | Status: AC
  Administered 2014-07-21: 25 mg via ORAL
  Filled 2014-07-21: qty 1

## 2014-07-21 MED ORDER — CEFAZOLIN SODIUM-DEXTROSE 2-3 GM-% IV SOLR
2.0000 g | INTRAVENOUS | Status: AC
  Administered 2014-07-21: 2 g via INTRAVENOUS
  Filled 2014-07-21: qty 50

## 2014-07-21 MED ORDER — DIAZEPAM 5 MG PO TABS
10.0000 mg | ORAL_TABLET | ORAL | Status: AC
  Administered 2014-07-21: 10 mg via ORAL
  Filled 2014-07-21: qty 2

## 2014-07-21 MED ORDER — DEXTROSE-NACL 5-0.45 % IV SOLN
INTRAVENOUS | Status: DC
  Administered 2014-07-21: 08:00:00 via INTRAVENOUS

## 2014-07-21 MED ORDER — ONDANSETRON HCL 4 MG/2ML IJ SOLN
4.0000 mg | Freq: Once | INTRAMUSCULAR | Status: AC
  Administered 2014-07-21: 4 mg via INTRAVENOUS
  Filled 2014-07-21: qty 2

## 2014-07-21 NOTE — Interval H&P Note (Signed)
History and Physical Interval Note:  07/21/2014 8:39 AM  Emily Berg  has presented today for surgery, with the diagnosis of RIGHT RENAL CALCULUS  The various methods of treatment have been discussed with the patient and family. After consideration of risks, benefits and other options for treatment, the patient has consented to  Procedure(s): RIGHT EXTRACORPOREAL SHOCK WAVE LITHOTRIPSY (ESWL) (Right) as a surgical intervention .  The patient's history has been reviewed, patient examined, no change in status, stable for surgery.  I have reviewed the patient's chart and labs.  Questions were answered to the patient's satisfaction.  Has had JJ stent placed and is now here for ESWL [name]

## 2014-07-21 NOTE — Discharge Instructions (Signed)
See [facility] discharge instructions in chart.  We will call you to get you a f/u sooner than 2-12

## 2014-07-21 NOTE — H&P (View-Only) (Signed)
History of Present Illness   Emily Berg presents today as a more urgent walk-in with a newly diagnosed right renal pelvic stone. Emily Berg is currently 44 years of age. She required intervention for a left proximal ureteral stone back in 2010. She was acutely ill and required urgent stent placement secondary to ongoing pain and nausea followed by lithotripsy. That procedure worked well. At that time, she was known to have a 3 mm stone in her right renal unit. 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 Followup studies indicated that her calcium level normalized, but she not had a reassessment in quite some time. Clinically, she has done well but starting approximately 6 weeks ago, she began experiencing some intermittent right-sided flank/back discomfort. She recently underwent CT imaging of her abdomen and pelvis without contrast. This demonstrated a 12 mm x 15 mm stone in her right renal pelvis. There did not appear to be evidence of any high grade obstruction. There was also a very tiny 1 mm stone in the lower pole calyx. No left-sided renal calculi were noted. She has had some intermittent ongoing mild discomfort. She has been given a course of ciprofloxacin and also some hydrocodone. Her urinalysis today shows a small degree of pyuria. There is no significant hematuria today. I have reviewed her imaging studies. I have reviewed her previous records. Previous stones analysis was a mixture of calcium oxalate and calcium phosphate. It did appear that her stone fractured well with previous lithotripsy.      Past Medical History Problems  1. History of anemia (Z86.2) 2. History of esophageal reflux (Z87.19)  Surgical History Problems  1. History of Cesarean Section 2. History of Cystoscopy With Insertion Of Ureteral Stent Left 3. History of Lithotripsy  Current Meds 1. Cipro 500 MG Oral Tablet;  Therapy: (Recorded:08Jan2016) to  Recorded 2. Hydrocodone-Acetaminophen 5-325 MG Oral Tablet;  Therapy: (Recorded:08Jan2016) to Recorded 3. Junel Fe 24 TABS;  Therapy: (Recorded:08Jan2016) to Recorded  Allergies Medication  1. No Known Drug Allergies  Family History Problems  1. Family history of Diabetes Mellitus 2. Family history of Family Health Status - Father's Age   63 3. Family history of Family Health Status - Mother's Age   46 4. Family history of Family Health Status Number Of Children   1 son, 1 daughter 5. Family history of Nephrolithiasis : Father  Social History Problems    Denied: History of Alcohol Use   Caffeine Use   4 per day   Former Smoker   2 ppd x 44yrs, quit 76yrs ago   Marital History - Currently Married  Review of Systems Genitourinary, constitutional, skin, eye, otolaryngeal, hematologic/lymphatic, cardiovascular, pulmonary, endocrine, musculoskeletal, gastrointestinal, neurological and psychiatric system(s) were reviewed and pertinent findings if present are noted and are otherwise negative.  Gastrointestinal: nausea and flank pain, but no abdominal pain.    Vitals Vital Signs [Data Includes: Last 1 Day]  Recorded: [id number] 12:59PM  Height: 5 ft 6 in Weight: 214 lb  BMI Calculated: 34.54 BSA Calculated: 2.06 Blood Pressure: 138 / 89 Temperature: 99.3 F Heart Rate: 81  Physical Exam Constitutional: Well nourished and well developed . No acute distress.  ENT:. The ears and nose are normal in appearance.  Neck: The appearance of the neck is normal and no neck mass is present.  Pulmonary: No respiratory distress and normal respiratory rhythm and effort.  Cardiovascular: Heart rate and rhythm are normal . No peripheral edema.  Abdomen:  The abdomen is soft and nontender. No masses are palpated. No CVA tenderness. No hernias are palpable. No hepatosplenomegaly noted.    Results/Data Urine [Data Includes: Last 1 Day]   [id number]  COLOR YELLOW   APPEARANCE CLEAR    SPECIFIC GRAVITY 1.025   pH 5.5   GLUCOSE 250 mg/dL  BILIRUBIN NEG   KETONE NEG mg/dL  BLOOD TRACE   PROTEIN NEG mg/dL  UROBILINOGEN 0.2 mg/dL  NITRITE NEG   LEUKOCYTE ESTERASE SMALL   SQUAMOUS EPITHELIAL/HPF MODERATE   WBC 11-20 WBC/hpf  RBC 0-2 RBC/hpf  BACTERIA FEW   CRYSTALS NONE SEEN   CASTS NONE SEEN    Assessment Assessed  1. Nephrolithiasis (N20.0) 2. Pyuria (N39.0)  Plan Health Maintenance  1. UA With REFLEX; [Do Not Release]; Status:Complete;   Done: [id number] 12:32PM Nephrolithiasis  2. BASIC METABOLIC PANEL; Status:Hold For - Specimen/Data Collection,Appointment;  Requested MSX:[id number];  3. PTH INTACT with CALCIUM; Status:Hold For - Specimen/Data Collection,Appointment;  Requested MVV:[id number];  4. Follow-up Schedule Surgery Office  Follow-up  Status: Hold For - Appointment   Requested for: [id number]  Discussion/Summary   Emily Berg has a fairly large 15 mm fairly large stone in her right renal pelvis. It is currently not causing a significant degree of obstruction, but undoubtedly has been ball valving in and out of her ureteropelvic junction causing intermittent pain. Her urinalysis does show some pyuria, which I suspect is secondary to inflammation rather than infection. She did not have obvious pyelonephritis. I have asked her to reduce her ciprofloxacin dose to 250 twice a day while we are awaiting her culture results. This stone can be treated a variety of ways. Clearly, intervention is indicated. Percutaneous nephrolithotomy could be performed, but I think that would over aggressive therapy. My only concern with ESWL is the ability to pass all of those fragments. We talked about several options and have decided to proceed with double-J stent placement to reduce the risk of obstructing fragments followed by ESWL. The stent should allow for better ureteral dilation and an improved chance of passing the stone pieces spontaneously. She understands there is a  chance she will require multiple lithotripsy's or ureteroscopy to remove all of the fragments. We will try to set up something for the next week or so. She does have adequate pain medication at this time.   cc: Emily Berg and [facility]   Signatures Electronically signed by : Rana Snare, M.D.; Jul 11 2014  4:04PM EST

## 2014-07-21 NOTE — Op Note (Signed)
See [facility] OP note scanned into chart. 

## 2014-08-15 ENCOUNTER — Other Ambulatory Visit: Payer: Self-pay | Admitting: Obstetrics and Gynecology

## 2014-08-18 LAB — CYTOLOGY - PAP

## 2015-09-18 ENCOUNTER — Other Ambulatory Visit: Payer: Self-pay | Admitting: Obstetrics and Gynecology

## 2015-09-18 DIAGNOSIS — N6320 Unspecified lump in the left breast, unspecified quadrant: Secondary | ICD-10-CM

## 2015-12-01 ENCOUNTER — Other Ambulatory Visit: Payer: PRIVATE HEALTH INSURANCE

## 2016-07-20 ENCOUNTER — Other Ambulatory Visit: Payer: PRIVATE HEALTH INSURANCE

## 2016-07-25 ENCOUNTER — Ambulatory Visit
Admission: RE | Admit: 2016-07-25 | Discharge: 2016-07-25 | Disposition: A | Discharge status: Home / Self Care | Payer: 59 | Source: Ambulatory Visit | Attending: Obstetrics and Gynecology | Admitting: Obstetrics and Gynecology

## 2016-07-25 DIAGNOSIS — N6320 Unspecified lump in the left breast, unspecified quadrant: Secondary | ICD-10-CM

## 2016-07-25 DIAGNOSIS — N6321 Unspecified lump in the left breast, upper outer quadrant: Secondary | ICD-10-CM | POA: Diagnosis not present

## 2016-07-25 DIAGNOSIS — R922 Inconclusive mammogram: Secondary | ICD-10-CM | POA: Diagnosis not present

## 2016-08-17 DIAGNOSIS — I351 Nonrheumatic aortic (valve) insufficiency: Secondary | ICD-10-CM | POA: Diagnosis not present

## 2016-08-24 DIAGNOSIS — I351 Nonrheumatic aortic (valve) insufficiency: Secondary | ICD-10-CM | POA: Diagnosis not present

## 2016-08-24 DIAGNOSIS — R030 Elevated blood-pressure reading, without diagnosis of hypertension: Secondary | ICD-10-CM | POA: Diagnosis not present

## 2016-08-24 DIAGNOSIS — R0609 Other forms of dyspnea: Secondary | ICD-10-CM | POA: Diagnosis not present

## 2016-09-29 DIAGNOSIS — Z01419 Encounter for gynecological examination (general) (routine) without abnormal findings: Secondary | ICD-10-CM | POA: Diagnosis not present

## 2016-11-05 ENCOUNTER — Emergency Department (HOSPITAL_COMMUNITY)
Admission: EM | Admit: 2016-11-05 | Discharge: 2016-11-05 | Disposition: A | Discharge status: Home / Self Care | Payer: 59 | Attending: Emergency Medicine | Admitting: Emergency Medicine

## 2016-11-05 ENCOUNTER — Encounter (HOSPITAL_COMMUNITY): Payer: Self-pay

## 2016-11-05 DIAGNOSIS — Z209 Contact with and (suspected) exposure to unspecified communicable disease: Secondary | ICD-10-CM | POA: Diagnosis not present

## 2016-11-05 DIAGNOSIS — Z79899 Other long term (current) drug therapy: Secondary | ICD-10-CM | POA: Insufficient documentation

## 2016-11-05 DIAGNOSIS — Z87891 Personal history of nicotine dependence: Secondary | ICD-10-CM | POA: Diagnosis not present

## 2016-11-05 DIAGNOSIS — Z203 Contact with and (suspected) exposure to rabies: Secondary | ICD-10-CM | POA: Diagnosis not present

## 2016-11-05 MED ORDER — RABIES IMMUNE GLOBULIN 150 UNIT/ML IM INJ
20.0000 [IU]/kg | INJECTION | Freq: Once | INTRAMUSCULAR | Status: AC
  Administered 2016-11-05: 1800 [IU] via INTRAMUSCULAR
  Filled 2016-11-05: qty 12

## 2016-11-05 MED ORDER — RABIES VACCINE, PCEC IM SUSR
1.0000 mL | Freq: Once | INTRAMUSCULAR | Status: AC
  Administered 2016-11-05: 1 mL via INTRAMUSCULAR
  Filled 2016-11-05: qty 1

## 2016-11-05 NOTE — ED Triage Notes (Signed)
Patient sent from [facility] for evaluation of possible rabies exposure. States that something bounced off her head last night and her husband saw bats flying around. No saliva or droppings noted in hair. NAD. Concerned and requesting the series of vaccines

## 2016-11-05 NOTE — ED Provider Notes (Signed)
[facility] DEPT Provider Note   CSN: [id number] Arrival date & time: 11/05/16  1142  By signing my name below, I, Dora Sims, attest that this documentation has been prepared under the direction and in the presence of [employer], PA-C . Electronically Signed: Dora Sims, Scribe. 11/05/2016. 1:23 PM.  History   Chief Complaint Chief Complaint  Patient presents with  . Rabies Injection   The history is provided by the patient. No language interpreter was used.    HPI Comments: Emily Berg is a 46 y.o. female who presents to the Emergency Department requesting the rabies vaccination series. She states she was outside last night and felt something "bump me in the head". Patient notes there are bats in her area and is concerned she may have been bitten by one but did not feel a bite. She was evaluated at [facility] today and there were no wounds or animal droppings seen on her head. She was advised to come to the ED to receive the rabies vaccination series. Patient notes her head is not painful and she wants to receive the rabies series out of precaution. She has not had rabies vaccination in the past. She denies disorientation, hydrophobia, abdominal pain, nausea, vomiting, or any other associated symptoms.  Past Medical History:  Diagnosis Date  . Anemia    Hx of anemia  . History of kidney stones   . PONV (postoperative nausea and vomiting)   . Renal calculus, right     Patient Active Problem List   Diagnosis Date Noted  . Nephrolithiasis 07/16/2014    Past Surgical History:  Procedure Laterality Date  . CESAREAN SECTION  11-20-2005  . CYSTO/  LEFT RETROGRADE PYELOGRAM/  STENT PLACEMENT  11-04-2008  . CYSTOSCOPY W/ URETERAL STENT PLACEMENT Right 07/16/2014   Procedure: CYSTOSCOPY WITH RETROGRADE PYELOGRAM/URETERAL STENT PLACEMENT;  Surgeon: Bernestine Amass, MD;  Location: [facility];  Service: Urology;  Laterality: Right;  . EXTRACORPOREAL  SHOCK WAVE LITHOTRIPSY  2010  . RIGHT SUPERIOR PARATHYROIDECTOMY  02-26-2009    OB History    No data available       Home Medications    Prior to Admission medications   Medication Sig Start Date End Date Taking? Authorizing Provider  Meth-Hyo-M Bl-Na Phos-Ph Sal (URIBEL) 118 MG CAPS Take 1 capsule (118 mg total) by mouth 3 (three) times daily as needed. 07/16/14   Rana Snare, MD  norethindrone-ethinyl estradiol (JUNEL FE,GILDESS FE,LOESTRIN FE) 1-20 MG-MCG tablet Take 1 tablet by mouth every evening. 2000    [provider]  oxyCODONE-acetaminophen (ROXICET) 5-325 MG per tablet Take 1-2 tablets by mouth every 6 (six) hours as needed. 07/21/14   Rana Snare, MD    Family History No family history on file.  Social History Social History  Substance Use Topics  . Smoking status: Former Smoker    Packs/day: 2.00    Years: 10.00    Types: Cigarettes    Quit date: 07/14/2004  . Smokeless tobacco: Never Used  . Alcohol use Yes     Comment: OCCASIONAL     Allergies   Patient has no known allergies.   Review of Systems Review of Systems  Constitutional: Negative for chills and fever.  Respiratory: Negative for shortness of breath.   Cardiovascular: Negative for chest pain.  Gastrointestinal: Negative for abdominal pain, nausea and vomiting.  Musculoskeletal: Negative for arthralgias.  Skin: Negative for wound.  Neurological: Negative for headaches.  Psychiatric/Behavioral: Negative for confusion.  Physical Exam Updated Vital Signs BP (!) 156/81   Pulse 84   Temp 98.2 F (36.8 C) (Oral)   Resp 18   Ht 5\' 7"  (1.702 m)   Wt 89.5 kg   SpO2 96%   BMI 30.92 kg/m   Physical Exam  Constitutional: She is oriented to person, place, and time. She appears well-developed and well-nourished. No distress.  HENT:  Head: Normocephalic and atraumatic.  No evidence of bites or droppings in patient's hair or on head  Eyes: Conjunctivae and EOM are normal. Pupils  are equal, round, and reactive to light.  Neck: Normal range of motion. Neck supple. No JVD present. No tracheal deviation present.  Cardiovascular: Normal rate, regular rhythm and intact distal pulses.   Murmur heard. Whooshing murmur heard best at apex  Pulmonary/Chest: Effort normal and breath sounds normal. No respiratory distress.  Musculoskeletal: Normal range of motion.  Neurological: She is alert and oriented to person, place, and time.  Skin: Skin is warm and dry. Capillary refill takes less than 2 seconds.  Psychiatric: She has a normal mood and affect. Her behavior is normal.  Nursing note and vitals reviewed.  ED Treatments / Results  Labs (all labs ordered are listed, but only abnormal results are displayed) Labs Reviewed - No data to display  EKG  EKG Interpretation None       Radiology No results found.  Procedures Procedures (including critical care time)  DIAGNOSTIC STUDIES: Oxygen Saturation is 96% on RA, adequate by my interpretation.    COORDINATION OF CARE: 1:27 PM Discussed treatment plan with pt at bedside and pt agreed to plan.  Medications Ordered in ED Medications  rabies vaccine (RABAVERT) injection 1 mL (1 mL Intramuscular Given 11/05/16 1441)  rabies immune globulin (HYPERAB) injection 1,800 Units (1,800 Units Intramuscular Given 11/05/16 1442)     Initial Impression / Assessment and Plan / ED Course  I have reviewed the triage vital signs and the nursing notes.  Pertinent labs & imaging results that were available during my care of the patient were reviewed by me and considered in my medical decision making (see chart for details).     Patient with possible exposure to bat with no evidence of bite or symptoms of rabies infection. Patient afebrile, hypertensive on arrival which she attributes to anxiety, murmur heard on examination is known to patient. Rabies vaccine and IVIG administered in ED. Provided patient with dates for future  vaccinations and informed her that these may be obtained easily at urgent care. Patient aware of ED return precautions. Pt verbalized understanding of and agreement with plan and is safe for discharge home at this time.   Final Clinical Impressions(s) / ED Diagnoses   Final diagnoses:  Need for post exposure prophylaxis for rabies    New Prescriptions Discharge Medication List as of 11/05/2016  1:36 PM     I personally performed the services described in this documentation, which was scribed in my presence. The recorded information has been reviewed and is accurate.    Renita Papa, PA-C 11/05/16 1714    Charlesetta Shanks, MD 11/06/16 [phone]

## 2016-11-05 NOTE — Discharge Instructions (Signed)
He will need to complete the rabies vaccination series on days 3, 7, and 14. You may complete these at any urgent care and this can be a walk-in visit with little wait time. Return to the ED if any concerning symptoms develop.

## 2016-11-08 ENCOUNTER — Encounter (HOSPITAL_COMMUNITY): Payer: Self-pay | Admitting: Emergency Medicine

## 2016-11-08 ENCOUNTER — Ambulatory Visit (HOSPITAL_COMMUNITY)
Admission: EM | Admit: 2016-11-08 | Discharge: 2016-11-08 | Disposition: A | Discharge status: Home / Self Care | Payer: 59 | Attending: Physician Assistant | Admitting: Physician Assistant

## 2016-11-08 DIAGNOSIS — Z203 Contact with and (suspected) exposure to rabies: Secondary | ICD-10-CM | POA: Diagnosis not present

## 2016-11-08 DIAGNOSIS — Z23 Encounter for immunization: Secondary | ICD-10-CM | POA: Diagnosis not present

## 2016-11-08 MED ORDER — RABIES VACCINE, PCEC IM SUSR
INTRAMUSCULAR | Status: AC
  Filled 2016-11-08: qty 1

## 2016-11-08 MED ORDER — RABIES VACCINE, PCEC IM SUSR
1.0000 mL | Freq: Once | INTRAMUSCULAR | Status: AC
  Administered 2016-11-08: 1 mL via INTRAMUSCULAR

## 2016-11-08 NOTE — ED Triage Notes (Signed)
Pt here for first injection in her series after initial treatment in the ED.

## 2016-11-08 NOTE — Discharge Instructions (Signed)
Please return to the Urgent [place] on 11/12/2016 for the Day 7 injection in the Rabies vaccination series.

## 2016-11-12 ENCOUNTER — Ambulatory Visit (HOSPITAL_COMMUNITY)
Admission: EM | Admit: 2016-11-12 | Discharge: 2016-11-12 | Disposition: A | Discharge status: Home / Self Care | Payer: 59 | Attending: Internal Medicine | Admitting: Internal Medicine

## 2016-11-12 ENCOUNTER — Encounter (HOSPITAL_COMMUNITY): Payer: Self-pay | Admitting: Emergency Medicine

## 2016-11-12 DIAGNOSIS — Z203 Contact with and (suspected) exposure to rabies: Secondary | ICD-10-CM

## 2016-11-12 DIAGNOSIS — Z23 Encounter for immunization: Secondary | ICD-10-CM | POA: Diagnosis not present

## 2016-11-12 MED ORDER — RABIES VACCINE, PCEC IM SUSR
INTRAMUSCULAR | Status: AC
  Filled 2016-11-12: qty 1

## 2016-11-12 MED ORDER — RABIES VACCINE, PCEC IM SUSR
1.0000 mL | Freq: Once | INTRAMUSCULAR | Status: AC
  Administered 2016-11-12: 1 mL via INTRAMUSCULAR

## 2016-11-12 NOTE — ED Triage Notes (Signed)
Patient is in the department for rabies vaccine.

## 2016-11-19 ENCOUNTER — Ambulatory Visit (HOSPITAL_COMMUNITY)
Admission: EM | Admit: 2016-11-19 | Discharge: 2016-11-19 | Disposition: A | Discharge status: Home / Self Care | Payer: 59 | Attending: Internal Medicine | Admitting: Internal Medicine

## 2016-11-19 ENCOUNTER — Encounter (HOSPITAL_COMMUNITY): Payer: Self-pay | Admitting: *Deleted

## 2016-11-19 DIAGNOSIS — Z203 Contact with and (suspected) exposure to rabies: Secondary | ICD-10-CM | POA: Diagnosis not present

## 2016-11-19 DIAGNOSIS — N200 Calculus of kidney: Secondary | ICD-10-CM

## 2016-11-19 DIAGNOSIS — Z23 Encounter for immunization: Secondary | ICD-10-CM | POA: Diagnosis not present

## 2016-11-19 MED ORDER — RABIES VACCINE, PCEC IM SUSR
1.0000 mL | Freq: Once | INTRAMUSCULAR | Status: AC
  Administered 2016-11-19: 1 mL via INTRAMUSCULAR

## 2016-11-19 MED ORDER — RABIES VACCINE, PCEC IM SUSR
INTRAMUSCULAR | Status: AC
  Filled 2016-11-19: qty 1

## 2016-11-19 NOTE — Discharge Instructions (Signed)
Follow-up for any concerns.

## 2016-11-19 NOTE — ED Triage Notes (Signed)
Denies any c/o's.  Present for final rabies vaccination.

## 2016-12-08 DIAGNOSIS — R030 Elevated blood-pressure reading, without diagnosis of hypertension: Secondary | ICD-10-CM | POA: Diagnosis not present

## 2016-12-08 DIAGNOSIS — E786 Lipoprotein deficiency: Secondary | ICD-10-CM | POA: Diagnosis not present

## 2016-12-23 DIAGNOSIS — M79644 Pain in right finger(s): Secondary | ICD-10-CM | POA: Diagnosis not present

## 2016-12-29 DIAGNOSIS — R011 Cardiac murmur, unspecified: Secondary | ICD-10-CM

## 2016-12-29 DIAGNOSIS — R0609 Other forms of dyspnea: Secondary | ICD-10-CM | POA: Diagnosis not present

## 2016-12-29 DIAGNOSIS — I351 Nonrheumatic aortic (valve) insufficiency: Secondary | ICD-10-CM | POA: Diagnosis not present

## 2016-12-29 DIAGNOSIS — R030 Elevated blood-pressure reading, without diagnosis of hypertension: Secondary | ICD-10-CM | POA: Diagnosis not present

## 2016-12-29 HISTORY — DX: Cardiac murmur, unspecified: R01.1

## 2017-01-16 DIAGNOSIS — E786 Lipoprotein deficiency: Secondary | ICD-10-CM | POA: Diagnosis not present

## 2017-01-20 DIAGNOSIS — N921 Excessive and frequent menstruation with irregular cycle: Secondary | ICD-10-CM | POA: Diagnosis not present

## 2017-01-20 DIAGNOSIS — M545 Low back pain: Secondary | ICD-10-CM | POA: Diagnosis not present

## 2017-02-07 DIAGNOSIS — N926 Irregular menstruation, unspecified: Secondary | ICD-10-CM | POA: Diagnosis not present

## 2017-02-28 DIAGNOSIS — R1310 Dysphagia, unspecified: Secondary | ICD-10-CM | POA: Diagnosis not present

## 2017-03-01 ENCOUNTER — Other Ambulatory Visit: Payer: Self-pay | Admitting: Family Medicine

## 2017-03-01 DIAGNOSIS — R0989 Other specified symptoms and signs involving the circulatory and respiratory systems: Secondary | ICD-10-CM

## 2017-03-01 DIAGNOSIS — R1310 Dysphagia, unspecified: Secondary | ICD-10-CM

## 2017-03-01 DIAGNOSIS — R198 Other specified symptoms and signs involving the digestive system and abdomen: Secondary | ICD-10-CM

## 2017-03-03 ENCOUNTER — Ambulatory Visit
Admission: RE | Admit: 2017-03-03 | Discharge: 2017-03-03 | Disposition: A | Discharge status: Home / Self Care | Payer: 59 | Source: Ambulatory Visit | Attending: Family Medicine | Admitting: Family Medicine

## 2017-03-03 DIAGNOSIS — K449 Diaphragmatic hernia without obstruction or gangrene: Secondary | ICD-10-CM | POA: Diagnosis not present

## 2017-03-03 DIAGNOSIS — R0989 Other specified symptoms and signs involving the circulatory and respiratory systems: Secondary | ICD-10-CM

## 2017-03-03 DIAGNOSIS — R198 Other specified symptoms and signs involving the digestive system and abdomen: Secondary | ICD-10-CM

## 2017-03-03 DIAGNOSIS — R1310 Dysphagia, unspecified: Secondary | ICD-10-CM

## 2017-08-17 DIAGNOSIS — S83242A Other tear of medial meniscus, current injury, left knee, initial encounter: Secondary | ICD-10-CM | POA: Diagnosis not present

## 2017-08-19 DIAGNOSIS — M25562 Pain in left knee: Secondary | ICD-10-CM | POA: Diagnosis not present

## 2017-08-22 DIAGNOSIS — M25562 Pain in left knee: Secondary | ICD-10-CM | POA: Diagnosis not present

## 2017-09-12 DIAGNOSIS — M25562 Pain in left knee: Secondary | ICD-10-CM | POA: Diagnosis not present

## 2017-10-24 DIAGNOSIS — M25562 Pain in left knee: Secondary | ICD-10-CM | POA: Diagnosis not present

## 2017-11-28 DIAGNOSIS — M25562 Pain in left knee: Secondary | ICD-10-CM | POA: Diagnosis not present

## 2018-03-20 DIAGNOSIS — Z1231 Encounter for screening mammogram for malignant neoplasm of breast: Secondary | ICD-10-CM | POA: Diagnosis not present

## 2018-03-20 DIAGNOSIS — Z01419 Encounter for gynecological examination (general) (routine) without abnormal findings: Secondary | ICD-10-CM | POA: Diagnosis not present

## 2018-03-20 DIAGNOSIS — Z124 Encounter for screening for malignant neoplasm of cervix: Secondary | ICD-10-CM | POA: Diagnosis not present

## 2018-03-21 ENCOUNTER — Other Ambulatory Visit: Payer: Self-pay | Admitting: Obstetrics and Gynecology

## 2018-03-21 DIAGNOSIS — R928 Other abnormal and inconclusive findings on diagnostic imaging of breast: Secondary | ICD-10-CM

## 2018-03-23 ENCOUNTER — Ambulatory Visit
Admission: RE | Admit: 2018-03-23 | Discharge: 2018-03-23 | Disposition: A | Discharge status: Home / Self Care | Payer: 59 | Source: Ambulatory Visit | Attending: Obstetrics and Gynecology | Admitting: Obstetrics and Gynecology

## 2018-03-23 DIAGNOSIS — R922 Inconclusive mammogram: Secondary | ICD-10-CM | POA: Diagnosis not present

## 2018-03-23 DIAGNOSIS — R928 Other abnormal and inconclusive findings on diagnostic imaging of breast: Secondary | ICD-10-CM

## 2018-03-23 DIAGNOSIS — N6489 Other specified disorders of breast: Secondary | ICD-10-CM | POA: Diagnosis not present

## 2018-04-13 DIAGNOSIS — Z23 Encounter for immunization: Secondary | ICD-10-CM | POA: Diagnosis not present

## 2018-06-26 DIAGNOSIS — M25521 Pain in right elbow: Secondary | ICD-10-CM | POA: Diagnosis not present

## 2018-07-05 DIAGNOSIS — M25521 Pain in right elbow: Secondary | ICD-10-CM | POA: Diagnosis not present

## 2018-07-05 DIAGNOSIS — M7711 Lateral epicondylitis, right elbow: Secondary | ICD-10-CM | POA: Diagnosis not present

## 2018-07-12 ENCOUNTER — Other Ambulatory Visit: Payer: Self-pay

## 2018-07-12 ENCOUNTER — Encounter (HOSPITAL_BASED_OUTPATIENT_CLINIC_OR_DEPARTMENT_OTHER): Payer: Self-pay | Admitting: *Deleted

## 2018-07-16 DIAGNOSIS — M7711 Lateral epicondylitis, right elbow: Secondary | ICD-10-CM | POA: Diagnosis not present

## 2018-07-16 DIAGNOSIS — M25521 Pain in right elbow: Secondary | ICD-10-CM | POA: Diagnosis not present

## 2018-07-23 ENCOUNTER — Ambulatory Visit (HOSPITAL_BASED_OUTPATIENT_CLINIC_OR_DEPARTMENT_OTHER): Payer: 59 | Admitting: Anesthesiology

## 2018-07-23 ENCOUNTER — Ambulatory Visit (HOSPITAL_BASED_OUTPATIENT_CLINIC_OR_DEPARTMENT_OTHER)
Admission: RE | Admit: 2018-07-23 | Discharge: 2018-07-23 | Disposition: A | Discharge status: Home / Self Care | Payer: 59 | Attending: Orthopaedic Surgery | Admitting: Orthopaedic Surgery

## 2018-07-23 ENCOUNTER — Encounter (HOSPITAL_BASED_OUTPATIENT_CLINIC_OR_DEPARTMENT_OTHER): Payer: Self-pay

## 2018-07-23 ENCOUNTER — Other Ambulatory Visit: Payer: Self-pay

## 2018-07-23 ENCOUNTER — Encounter (HOSPITAL_BASED_OUTPATIENT_CLINIC_OR_DEPARTMENT_OTHER)
Admission: RE | Disposition: A | Discharge status: Home / Self Care | Payer: Self-pay | Source: Home / Self Care | Attending: Orthopaedic Surgery

## 2018-07-23 DIAGNOSIS — M24662 Ankylosis, left knee: Secondary | ICD-10-CM | POA: Insufficient documentation

## 2018-07-23 DIAGNOSIS — Z79899 Other long term (current) drug therapy: Secondary | ICD-10-CM | POA: Diagnosis not present

## 2018-07-23 DIAGNOSIS — Z793 Long term (current) use of hormonal contraceptives: Secondary | ICD-10-CM | POA: Diagnosis not present

## 2018-07-23 DIAGNOSIS — M2242 Chondromalacia patellae, left knee: Secondary | ICD-10-CM | POA: Insufficient documentation

## 2018-07-23 DIAGNOSIS — F419 Anxiety disorder, unspecified: Secondary | ICD-10-CM | POA: Diagnosis not present

## 2018-07-23 DIAGNOSIS — Z87891 Personal history of nicotine dependence: Secondary | ICD-10-CM | POA: Diagnosis not present

## 2018-07-23 HISTORY — DX: Unspecified osteoarthritis, unspecified site: M19.90

## 2018-07-23 HISTORY — DX: Anxiety disorder, unspecified: F41.9

## 2018-07-23 HISTORY — PX: KNEE ARTHROSCOPY: SHX127

## 2018-07-23 LAB — POCT PREGNANCY, URINE: Preg Test, Ur: NEGATIVE

## 2018-07-23 SURGERY — ARTHROSCOPY, KNEE
Anesthesia: General | Site: Knee | Laterality: Left

## 2018-07-23 MED ORDER — SCOPOLAMINE 1 MG/3DAYS TD PT72: 1.0000 | MEDICATED_PATCH | Freq: Once | TRANSDERMAL | Status: DC | PRN

## 2018-07-23 MED ORDER — LACTATED RINGERS IV SOLN: INTRAVENOUS | Status: DC

## 2018-07-23 MED ORDER — MEPERIDINE HCL 25 MG/ML IJ SOLN: 6.2500 mg | INTRAMUSCULAR | Status: DC | PRN

## 2018-07-23 MED ORDER — ACETAMINOPHEN 160 MG/5ML PO SOLN: 325.0000 mg | Freq: Once | ORAL | Status: DC

## 2018-07-23 MED ORDER — DEXAMETHASONE SODIUM PHOSPHATE 10 MG/ML IJ SOLN
INTRAMUSCULAR | Status: AC
  Filled 2018-07-23: qty 1

## 2018-07-23 MED ORDER — LIDOCAINE HCL (CARDIAC) PF 100 MG/5ML IV SOSY
PREFILLED_SYRINGE | INTRAVENOUS | Status: DC | PRN
  Administered 2018-07-23: 80 mg via INTRAVENOUS

## 2018-07-23 MED ORDER — OXYCODONE HCL 5 MG PO TABS: ORAL_TABLET | ORAL | 0 refills | Status: AC

## 2018-07-23 MED ORDER — LIDOCAINE 2% (20 MG/ML) 5 ML SYRINGE
INTRAMUSCULAR | Status: AC
  Filled 2018-07-23: qty 5

## 2018-07-23 MED ORDER — ACETAMINOPHEN 10 MG/ML IV SOLN: 1000.0000 mg | Freq: Once | INTRAVENOUS | Status: DC | PRN

## 2018-07-23 MED ORDER — DEXAMETHASONE SODIUM PHOSPHATE 4 MG/ML IJ SOLN
INTRAMUSCULAR | Status: DC | PRN
  Administered 2018-07-23: 10 mg via INTRAVENOUS

## 2018-07-23 MED ORDER — CEFAZOLIN SODIUM-DEXTROSE 2-4 GM/100ML-% IV SOLN
INTRAVENOUS | Status: AC
  Filled 2018-07-23: qty 100

## 2018-07-23 MED ORDER — PROMETHAZINE HCL 25 MG/ML IJ SOLN
INTRAMUSCULAR | Status: AC
  Filled 2018-07-23: qty 1

## 2018-07-23 MED ORDER — FENTANYL CITRATE (PF) 100 MCG/2ML IJ SOLN
INTRAMUSCULAR | Status: AC
  Filled 2018-07-23: qty 2

## 2018-07-23 MED ORDER — PROPOFOL 500 MG/50ML IV EMUL
INTRAVENOUS | Status: DC | PRN
  Administered 2018-07-23: 25 ug/kg/min via INTRAVENOUS

## 2018-07-23 MED ORDER — OMEPRAZOLE 20 MG PO CPDR: 20.0000 mg | DELAYED_RELEASE_CAPSULE | Freq: Every day | ORAL | 0 refills | Status: DC

## 2018-07-23 MED ORDER — MELOXICAM 7.5 MG PO TABS: 7.5000 mg | ORAL_TABLET | Freq: Every day | ORAL | 2 refills | Status: AC

## 2018-07-23 MED ORDER — HYDROMORPHONE HCL 1 MG/ML IJ SOLN: 0.2500 mg | INTRAMUSCULAR | Status: DC | PRN

## 2018-07-23 MED ORDER — LACTATED RINGERS IV SOLN
INTRAVENOUS | Status: DC
  Administered 2018-07-23 (×2): via INTRAVENOUS

## 2018-07-23 MED ORDER — PROMETHAZINE HCL 25 MG/ML IJ SOLN
6.2500 mg | INTRAMUSCULAR | Status: AC | PRN
  Administered 2018-07-23 (×2): 6.25 mg via INTRAVENOUS

## 2018-07-23 MED ORDER — PROPOFOL 10 MG/ML IV BOLUS
INTRAVENOUS | Status: DC | PRN
  Administered 2018-07-23: 200 mg via INTRAVENOUS

## 2018-07-23 MED ORDER — ONDANSETRON HCL 4 MG/2ML IJ SOLN
INTRAMUSCULAR | Status: AC
  Filled 2018-07-23: qty 2

## 2018-07-23 MED ORDER — FENTANYL CITRATE (PF) 100 MCG/2ML IJ SOLN
50.0000 ug | INTRAMUSCULAR | Status: AC | PRN
  Administered 2018-07-23 (×2): 25 ug via INTRAVENOUS
  Administered 2018-07-23: 50 ug via INTRAVENOUS

## 2018-07-23 MED ORDER — PROMETHAZINE HCL 25 MG PO TABS: 25.0000 mg | ORAL_TABLET | Freq: Four times a day (QID) | ORAL | 0 refills | Status: DC | PRN

## 2018-07-23 MED ORDER — SODIUM CHLORIDE 0.9 % IR SOLN
Status: DC | PRN
  Administered 2018-07-23: 6000 mL

## 2018-07-23 MED ORDER — SODIUM CHLORIDE (PF) 0.9 % IJ SOLN
INTRAMUSCULAR | Status: AC
  Filled 2018-07-23: qty 10

## 2018-07-23 MED ORDER — ASPIRIN 81 MG PO TABS: 81.0000 mg | ORAL_TABLET | Freq: Every day | ORAL | 0 refills | Status: AC

## 2018-07-23 MED ORDER — CEFAZOLIN SODIUM-DEXTROSE 2-4 GM/100ML-% IV SOLN
2.0000 g | INTRAVENOUS | Status: AC
  Administered 2018-07-23: 2 g via INTRAVENOUS

## 2018-07-23 MED ORDER — ACETAMINOPHEN 325 MG PO TABS: 325.0000 mg | ORAL_TABLET | Freq: Once | ORAL | Status: DC

## 2018-07-23 MED ORDER — CHLORHEXIDINE GLUCONATE 4 % EX LIQD: 60.0000 mL | Freq: Once | CUTANEOUS | Status: DC

## 2018-07-23 MED ORDER — PROPOFOL 10 MG/ML IV BOLUS
INTRAVENOUS | Status: AC
  Filled 2018-07-23: qty 20

## 2018-07-23 MED ORDER — ONDANSETRON HCL 4 MG/2ML IJ SOLN
INTRAMUSCULAR | Status: DC | PRN
  Administered 2018-07-23 (×2): 4 mg via INTRAVENOUS

## 2018-07-23 MED ORDER — MIDAZOLAM HCL 2 MG/2ML IJ SOLN
1.0000 mg | INTRAMUSCULAR | Status: DC | PRN
  Administered 2018-07-23: 2 mg via INTRAVENOUS

## 2018-07-23 MED ORDER — MIDAZOLAM HCL 2 MG/2ML IJ SOLN
INTRAMUSCULAR | Status: AC
  Filled 2018-07-23: qty 2

## 2018-07-23 MED ORDER — ACETAMINOPHEN 500 MG PO TABS: 1000.0000 mg | ORAL_TABLET | Freq: Three times a day (TID) | ORAL | 0 refills | Status: AC

## 2018-07-23 SURGICAL SUPPLY — 37 items
BANDAGE ACE 6X5 VEL STRL LF (GAUZE/BANDAGES/DRESSINGS) ×1 IMPLANT
BANDAGE ESMARK 6X9 LF (GAUZE/BANDAGES/DRESSINGS) IMPLANT
BLADE CLIPPER SURG (BLADE) IMPLANT
BNDG ESMARK 6X9 LF (GAUZE/BANDAGES/DRESSINGS) ×2
CHLORAPREP W/TINT 26ML (MISCELLANEOUS) ×2 IMPLANT
CUFF TOURNIQUET SINGLE 34IN LL (TOURNIQUET CUFF) ×1 IMPLANT
DISSECTOR 3.5MM X 13CM CVD (MISCELLANEOUS) ×1 IMPLANT
DISSECTOR 4.0MMX13CM CVD (MISCELLANEOUS) IMPLANT
DRAPE ARTHROSCOPY W/POUCH 90 (DRAPES) ×2 IMPLANT
DRAPE IMP U-DRAPE 54X76 (DRAPES) ×2 IMPLANT
DRAPE U-SHAPE 47X51 STRL (DRAPES) ×2 IMPLANT
GAUZE SPONGE 4X4 12PLY STRL (GAUZE/BANDAGES/DRESSINGS) ×1 IMPLANT
GAUZE XEROFORM 1X8 LF (GAUZE/BANDAGES/DRESSINGS) ×2 IMPLANT
GLOVE BIOGEL PI IND STRL 8 (GLOVE) ×1 IMPLANT
GLOVE BIOGEL PI INDICATOR 8 (GLOVE) ×3
GLOVE ECLIPSE 8.0 STRL XLNG CF (GLOVE) ×3 IMPLANT
GLOVE SURG SYN 8.0 (GLOVE) ×2 IMPLANT
GLOVE SURG SYN 8.0 PF PI (GLOVE) IMPLANT
GOWN STRL REUS W/ TWL LRG LVL3 (GOWN DISPOSABLE) ×2 IMPLANT
GOWN STRL REUS W/TWL LRG LVL3 (GOWN DISPOSABLE)
GOWN STRL REUS W/TWL XL LVL3 (GOWN DISPOSABLE) ×2 IMPLANT
KIT TURNOVER KIT B (KITS) ×2 IMPLANT
MANIFOLD NEPTUNE II (INSTRUMENTS) ×1 IMPLANT
NDL SAFETY ECLIPSE 18X1.5 (NEEDLE) ×1 IMPLANT
NEEDLE HYPO 18GX1.5 SHARP (NEEDLE)
NS IRRIG 1000ML POUR BTL (IV SOLUTION) IMPLANT
PACK ARTHROSCOPY DSU (CUSTOM PROCEDURE TRAY) ×2 IMPLANT
PAD ARMBOARD 7.5X6 YLW CONV (MISCELLANEOUS) ×2 IMPLANT
PADDING CAST COTTON 6X4 STRL (CAST SUPPLIES) ×1 IMPLANT
PROBE BIPOLAR ATHRO 135MM 90D (MISCELLANEOUS) ×1 IMPLANT
SHEET MEDIUM DRAPE 40X70 STRL (DRAPES) ×1 IMPLANT
SUT MNCRL AB 3-0 PS2 18 (SUTURE) IMPLANT
SUT MNCRL AB 4-0 PS2 18 (SUTURE) ×2 IMPLANT
SYR 5ML LUER SLIP (SYRINGE) ×1 IMPLANT
TOWEL GREEN STERILE FF (TOWEL DISPOSABLE) ×2 IMPLANT
TUBING ARTHROSCOPY IRRIG 16FT (MISCELLANEOUS) ×2 IMPLANT
WATER STERILE IRR 1000ML POUR (IV SOLUTION) ×1 IMPLANT

## 2018-07-23 NOTE — Op Note (Signed)
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  Patellofemoral and lateral joint were normal.  Anterior interval scarring resected.  Medially 2x3 cm lesion to medial femoral condyle Grade 3 with small 0.5x0.5 cm Grade 3 changes on tibia.  Intact meniscus with just free edge tearing debrided.  Ligaments intact.  Preop had loss of 10 degrees or so of flexion but able to manipulate that back at end of case.  Post-operative plan: The patient will be WBAT with PT to mobilize and keep motion.  The patient will be dc home.  DVT prophylaxis Aspirin 81mg  qd.  Pain control with PRN pain medication preferring oral medicines.  Follow up plan will be scheduled in approximately 7 days for incision check.  Post-Op Diagnosis: Same Surgeons:Primary: Hiram Gash, MD Assistants: Location: [place] OR ROOM 6 Anesthesia: General Antibiotics: Ancef 2g preop Tourniquet time: None Estimated Blood Loss:  Complications: None Specimens: None Implants: * No implants in log *  Indications for Surgery:   Emily Berg is a 48 y.o. female with continued medial left knee pain and MRI findings of chondral damage but no full thickness loss or bone on bone articulation.  Patient participates in high impact sports and not ideal candidate for unicompartmental knee.  Benefits and risks of operative and nonoperative management were discussed prior to surgery with patient/guardian(s) and informed consent form was completed.  Specific risks including infection, need for additional surgery, continued pain, stiffness, swelling.    2 standard anterior portals were made and diagnostic arthroscopy  performed. Please note the findings as noted above.  Anterior synvectomy and lysis of adhesions performed with a shaver.  Free edge meniscus was resected with a shaver medially.  Gentle chondroplasty performed to the medial femoral and tibia chondral lesions.  Gentle manipulation performed with full motion afterwards.  Incisions closed with absorbable suture. The patient was awoken from general anesthesia and taken to the PACU in stable condition without complication.

## 2018-07-23 NOTE — Discharge Instructions (Signed)

## 2018-07-23 NOTE — Anesthesia Preprocedure Evaluation (Addendum)
Anesthesia Evaluation  Patient identified by MRN, date of birth, ID band Patient awake    Reviewed: Allergy & Precautions, NPO status , Patient's Chart, lab work & pertinent test results  History of Anesthesia Complications (+) PONV  Airway Mallampati: III  TM Distance: >3 FB Neck ROM: Full    Dental  (+) Teeth Intact, Dental Advisory Given   Pulmonary former smoker,    breath sounds clear to auscultation       Cardiovascular negative cardio ROS   Rhythm:Regular Rate:Normal     Neuro/Psych Anxiety    GI/Hepatic negative GI ROS, Neg liver ROS,   Endo/Other    Renal/GU      Musculoskeletal  (+) Arthritis ,   Abdominal (+) + obese,   Peds  Hematology   Anesthesia Other Findings   Reproductive/Obstetrics                            Anesthesia Physical Anesthesia Plan  ASA: II  Anesthesia Plan: General   Post-op Pain Management:    Induction: Intravenous  PONV Risk Score and Plan: 4 or greater and Ondansetron, Dexamethasone, Midazolam and Scopolamine patch - Pre-op  Airway Management Planned: LMA  Additional Equipment: None  Intra-op Plan:   Post-operative Plan: Extubation in OR  Informed Consent: I have reviewed the patients History and Physical, chart, labs and discussed the procedure including the risks, benefits and alternatives for the proposed anesthesia with the patient or authorized representative who has indicated his/her understanding and acceptance.     Dental advisory given  Plan Discussed with: CRNA  Anesthesia Plan Comments:        Anesthesia Quick Evaluation

## 2018-07-23 NOTE — H&P (Signed)
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 Symptoms are rated as moderate to severe, and have been worsening.  This is significantly impairing activities of daily living.  Please see my clinic note for full details on this patient's care.  She has elected for surgical management.   Past Medical History:  Diagnosis Date  . Anemia    Hx of anemia  . Anxiety    takes prozac  . Arthritis    left knee  . History of kidney stones   . PONV (postoperative nausea and vomiting)   . Renal calculus, right    Past Surgical History:  Procedure Laterality Date  . CESAREAN SECTION  11-20-2005  . CYSTO/  LEFT RETROGRADE PYELOGRAM/  STENT PLACEMENT  11-04-2008  . CYSTOSCOPY W/ URETERAL STENT PLACEMENT Right 07/16/2014   Procedure: CYSTOSCOPY WITH RETROGRADE PYELOGRAM/URETERAL STENT PLACEMENT;  Surgeon: Bernestine Amass, MD;  Location: [facility];  Service: Urology;  Laterality: Right;  . EXTRACORPOREAL SHOCK WAVE LITHOTRIPSY  2010  . RIGHT SUPERIOR PARATHYROIDECTOMY  02-26-2009   Social History   Socioeconomic History  . Marital status: Married    Spouse name: Not on file  . Number of children: Not on file  . Years of education: Not on file  . Highest education level: Not on file  Occupational History  . Not on file  Social Needs  . Financial resource strain: Not on file  . Food insecurity:    Worry: Not on file    Inability: Not on file  . Transportation needs:    Medical: Not on file    Non-medical: Not on file  Tobacco Use  . Smoking status: Former Smoker    Packs/day: 2.00    Years: 10.00    Pack years: 20.00    Types: Cigarettes    Last attempt to quit: 07/14/2004    Years since quitting: 14.0  . Smokeless  tobacco: Never Used  Substance and Sexual Activity  . Alcohol use: Yes    Comment: OCCASIONAL  . Drug use: No  . Sexual activity: Not on file  Lifestyle  . Physical activity:    Days per week: Not on file    Minutes per session: Not on file  . Stress: Not on file  Relationships  . Social connections:    Talks on phone: Not on file    Gets together: Not on file    Attends religious service: Not on file    Active member of club or organization: Not on file    Attends meetings of clubs or organizations: Not on file    Relationship status: Not on file  Other Topics Concern  . Not on file  Social History Narrative  . Not on file   History reviewed. No pertinent family history. No Known Allergies Prior to Admission medications   Medication Sig Start Date End Date Taking? Authorizing Provider  FLUoxetine (PROZAC) 20 MG tablet Take 20 mg by mouth daily.   Yes [provider]  norethindrone-ethinyl estradiol (JUNEL FE,GILDESS FE,LOESTRIN FE) 1-20 MG-MCG tablet Take 1 tablet by mouth every evening. 2000   Yes [provider]     Positive ROS: All other systems have been reviewed and were otherwise negative with the exception of those mentioned in the HPI  and as above.  Physical Exam: General: Alert, no acute distress Cardiovascular: No pedal edema Respiratory: No cyanosis, no use of accessory musculature GI: No organomegaly, abdomen is soft and non-tender Skin: No lesions in the area of chief complaint Neurologic: Sensation intact distally Psychiatric: Patient is competent for consent with normal mood and affect Lymphatic: No axillary or cervical lymphadenopathy  MUSCULOSKELETAL: L knee pain medially and stiffness.  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   Hiram Gash, MD  07/23/2018 8:43 AM

## 2018-07-23 NOTE — Anesthesia Procedure Notes (Signed)
Procedure Name: LMA Insertion Date/Time: 07/23/2018 9:15 AM Performed by: Lyndee Leo, CRNA Pre-anesthesia Checklist: Patient identified, Emergency Drugs available, Suction available and Patient being monitored Patient Re-evaluated:Patient Re-evaluated prior to induction Oxygen Delivery Method: Circle system utilized Preoxygenation: Pre-oxygenation with 100% oxygen Induction Type: IV induction Ventilation: Mask ventilation without difficulty LMA: LMA inserted LMA Size: 4.0 Number of attempts: 1 Airway Equipment and Method: Bite block Placement Confirmation: positive ETCO2 Tube secured with: Tape Dental Injury: Teeth and Oropharynx as per pre-operative assessment

## 2018-07-23 NOTE — Transfer of Care (Signed)
Immediate Anesthesia Transfer of Care Note  Patient: Emily Berg  Procedure(s) Performed: ARTHROSCOPY LEFT KNEE WITH CHONDROPLASTY, MANIPULATION WITH LYSIS OF ADHESIONS (Left Knee)  Patient Location: PACU  Anesthesia Type:General  Level of Consciousness: awake, sedated and patient cooperative  Airway & Oxygen Therapy: Patient Spontanous Breathing and Patient connected to face mask oxygen  Post-op Assessment: Report given to RN and Post -op Vital signs reviewed and stable  Post vital signs: Reviewed and stable  Last Vitals:  Vitals Value Taken Time  BP 138/87 07/23/2018  9:57 AM  Temp    Pulse 98 07/23/2018  9:57 AM  Resp 10 07/23/2018  9:57 AM  SpO2 96 % 07/23/2018  9:57 AM  Vitals shown include unvalidated device data.  Last Pain:  Vitals:   07/23/18 0801  TempSrc: Oral  PainSc: 0-No pain         Complications: No apparent anesthesia complications

## 2018-07-23 NOTE — Anesthesia Postprocedure Evaluation (Signed)
Anesthesia Post Note  Patient: Emily Berg, Emily Berg  Procedure(s) Performed: ARTHROSCOPY LEFT KNEE WITH CHONDROPLASTY, MANIPULATION WITH LYSIS OF ADHESIONS (Left Knee)     Patient location during evaluation: PACU Anesthesia Type: General Level of consciousness: awake and alert and oriented Pain management: pain level controlled Vital Signs Assessment: post-procedure vital signs reviewed and stable Respiratory status: spontaneous breathing, nonlabored ventilation and respiratory function stable Cardiovascular status: blood pressure returned to baseline and stable Postop Assessment: no apparent nausea or vomiting Anesthetic complications: no    Last Vitals:  Vitals:   07/23/18 1100 07/23/18 1115  BP: 127/78 (!) 145/88  Pulse: 83 78  Resp: 13 18  Temp:  37.2 C  SpO2: 96% 99%    Last Pain:  Vitals:   07/23/18 1115  TempSrc:   PainSc: 1     LLE Motor Response: Purposeful movement (07/23/18 1115) LLE Sensation: Full sensation (07/23/18 1115)          Laurin Paulo A.

## 2018-07-24 ENCOUNTER — Encounter (HOSPITAL_BASED_OUTPATIENT_CLINIC_OR_DEPARTMENT_OTHER): Payer: Self-pay | Admitting: Orthopaedic Surgery

## 2018-07-24 DIAGNOSIS — M25662 Stiffness of left knee, not elsewhere classified: Secondary | ICD-10-CM | POA: Diagnosis not present

## 2018-07-24 DIAGNOSIS — R262 Difficulty in walking, not elsewhere classified: Secondary | ICD-10-CM | POA: Diagnosis not present

## 2018-07-24 DIAGNOSIS — M6281 Muscle weakness (generalized): Secondary | ICD-10-CM | POA: Diagnosis not present

## 2018-07-27 DIAGNOSIS — M25662 Stiffness of left knee, not elsewhere classified: Secondary | ICD-10-CM | POA: Diagnosis not present

## 2018-07-27 DIAGNOSIS — M6281 Muscle weakness (generalized): Secondary | ICD-10-CM | POA: Diagnosis not present

## 2018-07-27 DIAGNOSIS — R262 Difficulty in walking, not elsewhere classified: Secondary | ICD-10-CM | POA: Diagnosis not present

## 2018-07-30 DIAGNOSIS — M25662 Stiffness of left knee, not elsewhere classified: Secondary | ICD-10-CM | POA: Diagnosis not present

## 2018-07-30 DIAGNOSIS — M6281 Muscle weakness (generalized): Secondary | ICD-10-CM | POA: Diagnosis not present

## 2018-07-30 DIAGNOSIS — R262 Difficulty in walking, not elsewhere classified: Secondary | ICD-10-CM | POA: Diagnosis not present

## 2018-08-03 DIAGNOSIS — M25662 Stiffness of left knee, not elsewhere classified: Secondary | ICD-10-CM | POA: Diagnosis not present

## 2018-08-03 DIAGNOSIS — R262 Difficulty in walking, not elsewhere classified: Secondary | ICD-10-CM | POA: Diagnosis not present

## 2018-08-03 DIAGNOSIS — M6281 Muscle weakness (generalized): Secondary | ICD-10-CM | POA: Diagnosis not present

## 2018-08-06 DIAGNOSIS — M25662 Stiffness of left knee, not elsewhere classified: Secondary | ICD-10-CM | POA: Diagnosis not present

## 2018-08-06 DIAGNOSIS — M6281 Muscle weakness (generalized): Secondary | ICD-10-CM | POA: Diagnosis not present

## 2018-08-06 DIAGNOSIS — R262 Difficulty in walking, not elsewhere classified: Secondary | ICD-10-CM | POA: Diagnosis not present

## 2018-08-10 DIAGNOSIS — M25662 Stiffness of left knee, not elsewhere classified: Secondary | ICD-10-CM | POA: Diagnosis not present

## 2018-08-10 DIAGNOSIS — R262 Difficulty in walking, not elsewhere classified: Secondary | ICD-10-CM | POA: Diagnosis not present

## 2018-08-10 DIAGNOSIS — M6281 Muscle weakness (generalized): Secondary | ICD-10-CM | POA: Diagnosis not present

## 2018-08-17 DIAGNOSIS — M25662 Stiffness of left knee, not elsewhere classified: Secondary | ICD-10-CM | POA: Diagnosis not present

## 2018-08-17 DIAGNOSIS — R262 Difficulty in walking, not elsewhere classified: Secondary | ICD-10-CM | POA: Diagnosis not present

## 2018-08-17 DIAGNOSIS — M6281 Muscle weakness (generalized): Secondary | ICD-10-CM | POA: Diagnosis not present

## 2018-08-20 DIAGNOSIS — R262 Difficulty in walking, not elsewhere classified: Secondary | ICD-10-CM | POA: Diagnosis not present

## 2018-08-20 DIAGNOSIS — M6281 Muscle weakness (generalized): Secondary | ICD-10-CM | POA: Diagnosis not present

## 2018-08-20 DIAGNOSIS — M25662 Stiffness of left knee, not elsewhere classified: Secondary | ICD-10-CM | POA: Diagnosis not present

## 2018-08-27 ENCOUNTER — Encounter: Payer: Self-pay | Admitting: Registered"

## 2018-08-27 ENCOUNTER — Encounter: Payer: 59 | Attending: Family Medicine | Admitting: Registered"

## 2018-08-27 DIAGNOSIS — E6609 Other obesity due to excess calories: Secondary | ICD-10-CM | POA: Diagnosis present

## 2018-08-27 NOTE — Progress Notes (Signed)
Medical Nutrition Therapy:  Appt start time: 4431 end time:  0953.  Assessment:  Primary concerns today: Pt referred for weight management. Pt reports her cardiologist told her she needs to lose weight to help prevent heart disease.  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   Pt reports that she has a tendency to not eat until late, often the evening, and then will want to eat everything. Pt reports busy schedule as a barrier to eating healthfully. 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 Pt has a son that is homeschooled and also keeps her 3 year old granddaughter and a relative with dementia during the day when not working. Reports it is difficult for her to go to the bathroom at times when keeping her granddaughter. Pt reports she relies heavily on convenience foods due to busy schedule. Pt reports that she goes to bed around 9:30 PM and gets up around 7 AM, however, she reports that she does not get good sleep because she wakes up often. Pt reports that her family is picky about what foods they will eat. Pt reports that her husband will not healthy foods if she prepares them. Reports her husband wants carbohydrate heavy meals. Pt reports she is going to go vegetarian for lent and would like to stay vegetarian. Reports she followed a vegetarian diet in the past for a couple years. Pt reports that she struggles with drinking enough fluids. Reports she does not like having to go to the bathroom more often and it is especially hard when she is keeping her granddaughter.   Pt reports she has followed a 1200 calorie diet in the past and she lost while while on it but gained it back. Reports she has tried the keto diet but was concerned about having a high fat intake for heart health concerns. Pt requests specific information about how many calories and macronutrients she should consume. Pt  reports that she does best when given specific guidelines.     Preferred Learning Style:   No preference indicated   Learning Readiness:   Ready  MEDICATIONS: See list.    DIETARY INTAKE:  Usual eating pattern includes 1-2 meals and grazing on snacks at night.   Common foods: chips, microwavable foods.  Avoided foods include liver.   Locations of Meals/Snacks: livingroom; TV is present during meals.     Typical Snacks: potato chips, anything pt can find at night.   24-hr recall: Weekend Day B ( AM): None reported.  Snk ( AM): None reported.  L ( PM): [employer]- burger, fries, custard, sweet tea  Snk ( PM): None reported.  D ( PM): chips with french onion dip, sweet tea  Snk ( PM): seaweed; 8 club crackers Beverages: 1 medium sized sweet tea; 1/2 cup coffee with creamer and equal; sips of flavored water  24-hr recall: Typical work day  B ( AM): None reported.  Snk ( AM): None reported.  L ( PM): None reported.  Snk (3 PM): [employer],  D (730 PM): ramen noodles OR Lean Cuisine Meals, flavored water  Snk ( PM): grazing on crackers, whatever is in house Beverages: 2-3 x 16 oz coffee with creamer and equal; around 16-20 oz water daily.   Usual physical activity: None currently since knee surgery. Was tae kwon do about 4 nights per week between classes and teaching. Is  planning to start swimming next week starting in March. Plans to go for around 1 hour x 7 days per week.    Estimated energy needs: (To promote weight loss of ~1.5-2 lb per week)  1200-1500 calories 165-188 g carbohydrates 60-75 g protein 33-42 g fat  Progress Towards Goal(s):  In progress.   Nutritional Diagnosis:  NI-5.11.1 Predicted suboptimal nutrient intake As related to skipping meals, inadequate water intake .  As evidenced by pt's reported dietary recall and habits .    Intervention:  Nutrition counseling provided. Dietitian provided education regarding importance of regular meals and balanced  nutrition. Dietitian provided education on mindful eating and encouraged eating mindfully over counting calories. Discussed that including regular meals/eating every 3-5 hours and having more balance at meals will be very healthful in preventing urge to overeat and graze at night. Provided education regarding vegetarian diet and discussed ensuring adequate protein intake. Dietitian provided calorie and macronutrient range to promote weight loss per pt request. Dietitian discussed that main goal is to have regular, balanced meals like the plate example and listening to hunger/satiety cues as we eat over following a specific calorie range. Recommended prepping meals ahead of time to help ensure she receive regular, balanced meals with busy schedule. Recommended tracking water intake and discussed that current fluid intake is very inadequate and the stress on kidneys when not receiving adequate water. Pt appeared agreeable to information/goals discussed.   Instructions/Goals:   Make sure to get in three meals per day. Try to have balanced meals like the My Plate example (see handout). Include lean proteins, vegetables, fruits, and whole grains at meals.   Eat every 3-5 hours  Recommend prepping out meals/snacks on weekends for the upcoming week.   Recommend tracking food and water intake.   Recommended Nutrient Intake to promote weight loss (per pt request):   1200-1500 calories  165-188 g carbohydrates  60-75 g protein  33-42 g fat   Water Goal: Include at least 64 oz water daily.   Make physical activity a part of your week. Try to include at least 30 minutes of physical activity 5 days each week or at least 150 minutes per week. Regular physical activity promotes overall health-including helping to reduce risk for heart disease and diabetes, promoting mental health, and helping [place] sleep better.   Continue with multivitamin with iron.    Teaching Method Utilized:  Visual Auditory  Handouts  given during visit include:  Balanced plate and food list.   Barriers to learning/adherence to lifestyle change: busy schedule and multiple responsibilities (pt keeps granddaughter and relative with dementia, homeschools son and works part-time)  Demonstrated degree of understanding via:  Teach Back   Monitoring/Evaluation:  Dietary intake, exercise, and body weight in 1 month(s).

## 2018-08-27 NOTE — Patient Instructions (Addendum)
Instructions/Goals:   Make sure to get in three meals per day. Try to have balanced meals like the My Plate example (see handout). Include lean proteins, vegetables, fruits, and whole grains at meals.   Eat every 3-5 hours  Recommend prepping out meals/snacks on weekends for the upcoming week.   Recommend tracking food and water intake.   Recommended Nutrient Intake:   1200-1500 calories  165-188 g carbohydrates  60-75 g protein  33-42 g fat   Water Goal: Include at least 64 oz water daily.   Make physical activity a part of your week. Try to include at least 30 minutes of physical activity 5 days each week or at least 150 minutes per week. Regular physical activity promotes overall health-including helping to reduce risk for heart disease and diabetes, promoting mental health, and helping [place] sleep better.   Continue with multivitamin with iron.

## 2018-08-28 DIAGNOSIS — M6281 Muscle weakness (generalized): Secondary | ICD-10-CM | POA: Diagnosis not present

## 2018-08-28 DIAGNOSIS — M25662 Stiffness of left knee, not elsewhere classified: Secondary | ICD-10-CM | POA: Diagnosis not present

## 2018-08-28 DIAGNOSIS — R262 Difficulty in walking, not elsewhere classified: Secondary | ICD-10-CM | POA: Diagnosis not present

## 2018-08-28 DIAGNOSIS — Z719 Counseling, unspecified: Secondary | ICD-10-CM | POA: Diagnosis not present

## 2018-08-30 DIAGNOSIS — Z719 Counseling, unspecified: Secondary | ICD-10-CM | POA: Diagnosis not present

## 2018-08-31 DIAGNOSIS — R262 Difficulty in walking, not elsewhere classified: Secondary | ICD-10-CM | POA: Diagnosis not present

## 2018-08-31 DIAGNOSIS — M25662 Stiffness of left knee, not elsewhere classified: Secondary | ICD-10-CM | POA: Diagnosis not present

## 2018-08-31 DIAGNOSIS — M6281 Muscle weakness (generalized): Secondary | ICD-10-CM | POA: Diagnosis not present

## 2018-09-03 DIAGNOSIS — M25662 Stiffness of left knee, not elsewhere classified: Secondary | ICD-10-CM | POA: Diagnosis not present

## 2018-09-03 DIAGNOSIS — R262 Difficulty in walking, not elsewhere classified: Secondary | ICD-10-CM | POA: Diagnosis not present

## 2018-09-03 DIAGNOSIS — M6281 Muscle weakness (generalized): Secondary | ICD-10-CM | POA: Diagnosis not present

## 2018-09-06 DIAGNOSIS — Z719 Counseling, unspecified: Secondary | ICD-10-CM | POA: Diagnosis not present

## 2018-09-07 DIAGNOSIS — M6281 Muscle weakness (generalized): Secondary | ICD-10-CM | POA: Diagnosis not present

## 2018-09-07 DIAGNOSIS — M25662 Stiffness of left knee, not elsewhere classified: Secondary | ICD-10-CM | POA: Diagnosis not present

## 2018-09-07 DIAGNOSIS — R262 Difficulty in walking, not elsewhere classified: Secondary | ICD-10-CM | POA: Diagnosis not present

## 2018-09-18 DIAGNOSIS — Z719 Counseling, unspecified: Secondary | ICD-10-CM | POA: Diagnosis not present

## 2018-09-20 DIAGNOSIS — Z719 Counseling, unspecified: Secondary | ICD-10-CM | POA: Diagnosis not present

## 2018-09-24 ENCOUNTER — Ambulatory Visit: Payer: 59 | Admitting: Registered"

## 2018-10-11 DIAGNOSIS — Z719 Counseling, unspecified: Secondary | ICD-10-CM | POA: Diagnosis not present

## 2018-10-16 IMAGING — MG 2D DIGITAL DIAGNOSTIC BILATERAL MAMMOGRAM WITH CAD AND ADJUNCT T
8 of 15 series · 8 of 35 positions shown · non-contrast
Comparison: Previous exam(s).

CLINICAL DATA: Delayed left breast follow-up

EXAM:
2D DIGITAL DIAGNOSTIC BILATERAL MAMMOGRAM WITH CAD AND ADJUNCT TOMO
ULTRASOUND LEFT BREAST

[L CC synth-2D (1 of 2)]
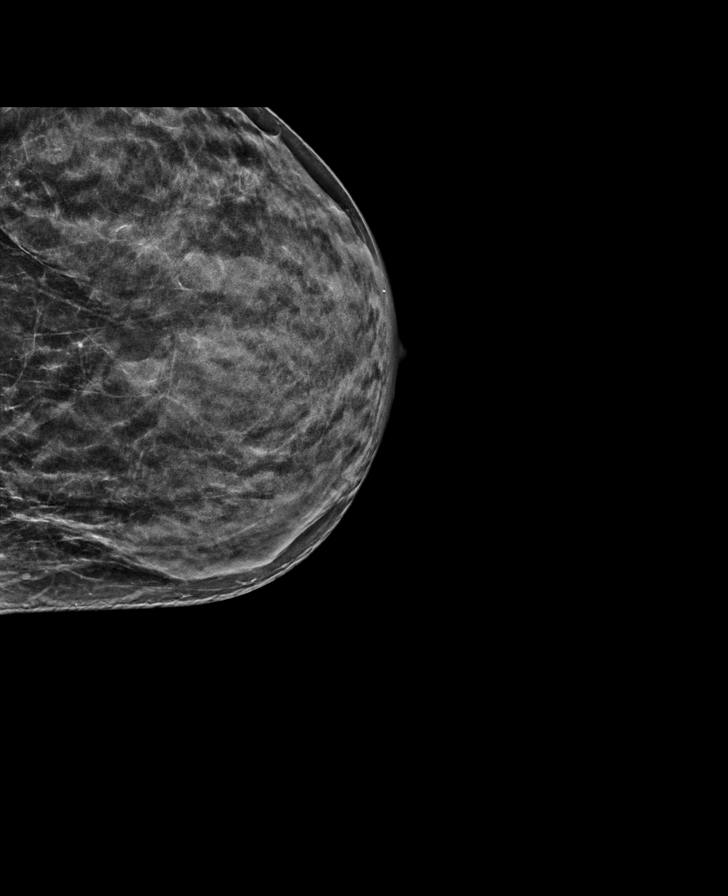

[L CC]
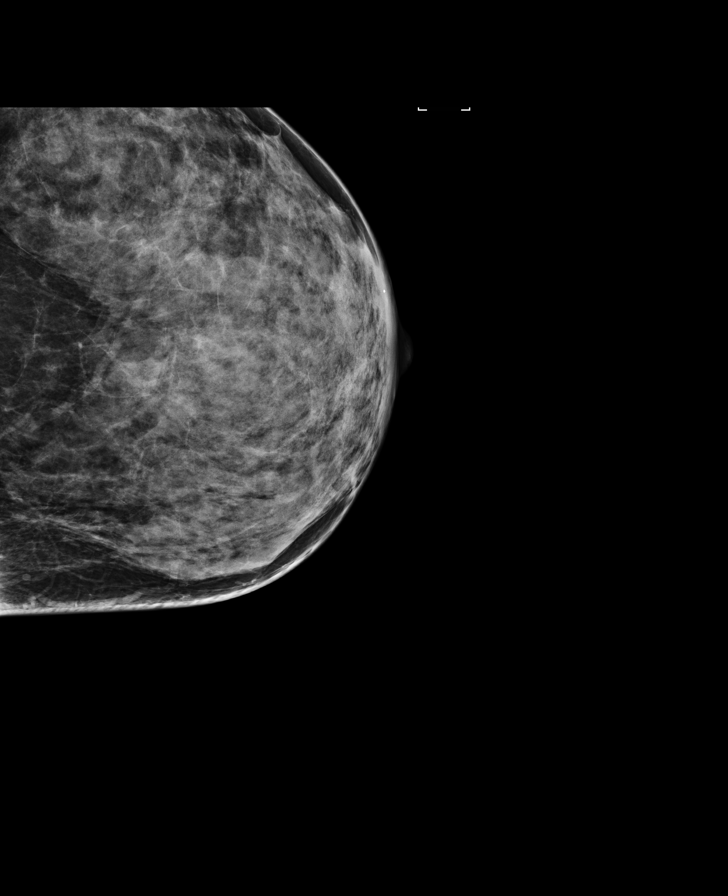

[L MLO synth-2D]
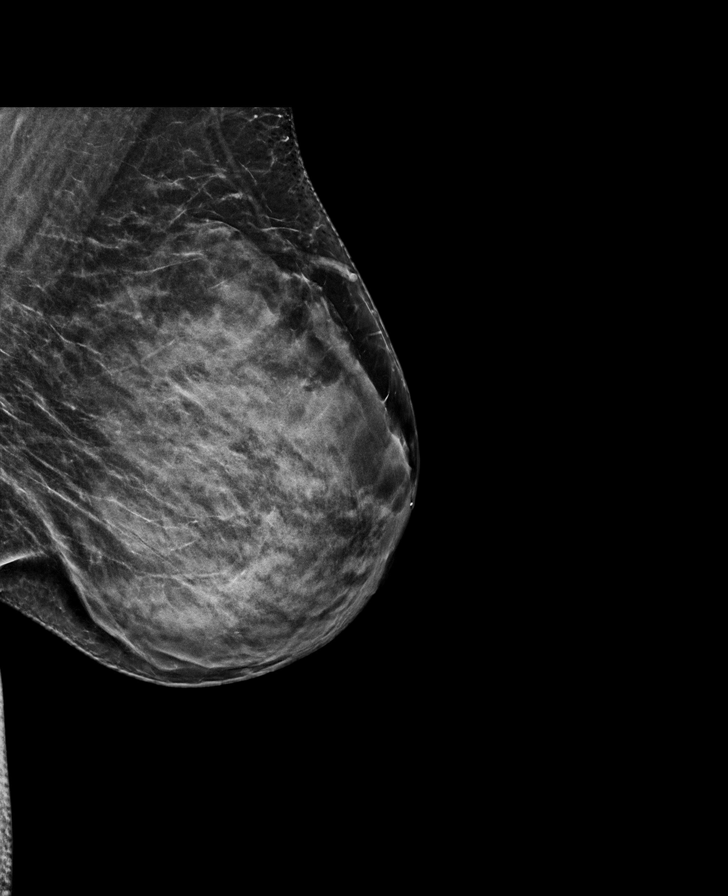

[R CC synth-2D]
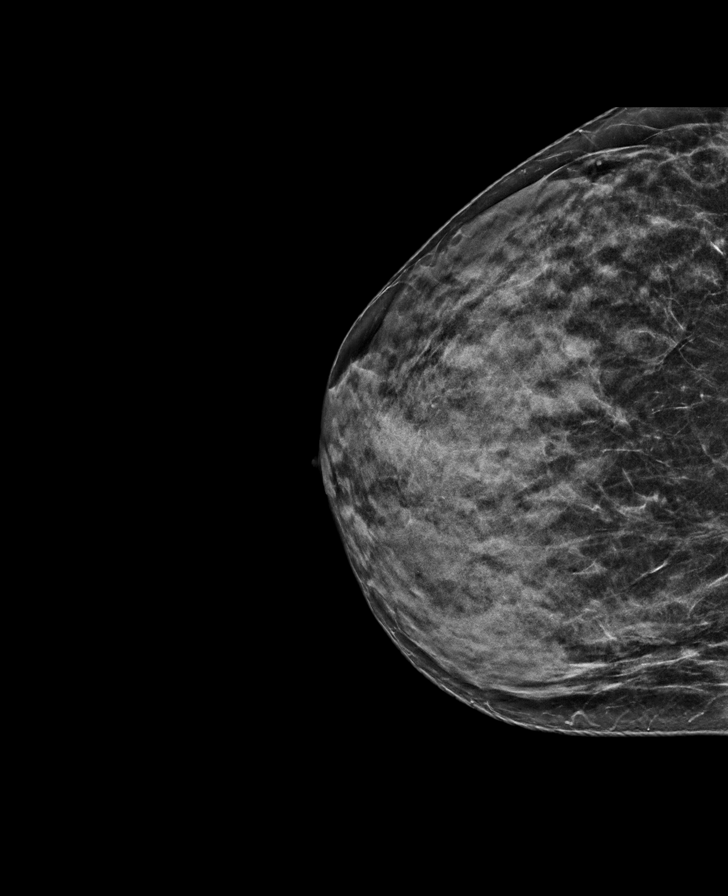

[R MLO]
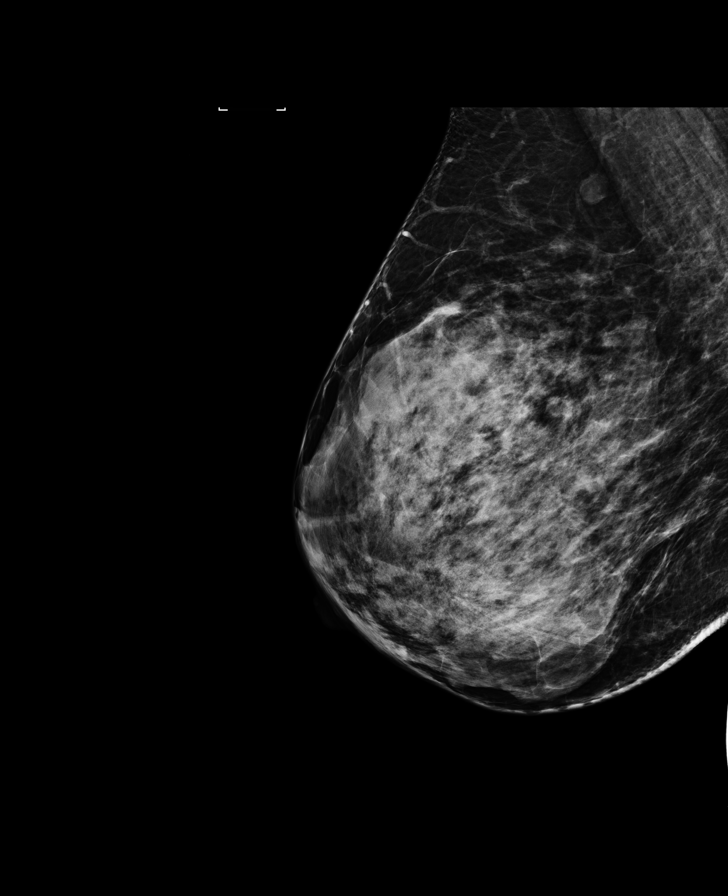

[L MLO]
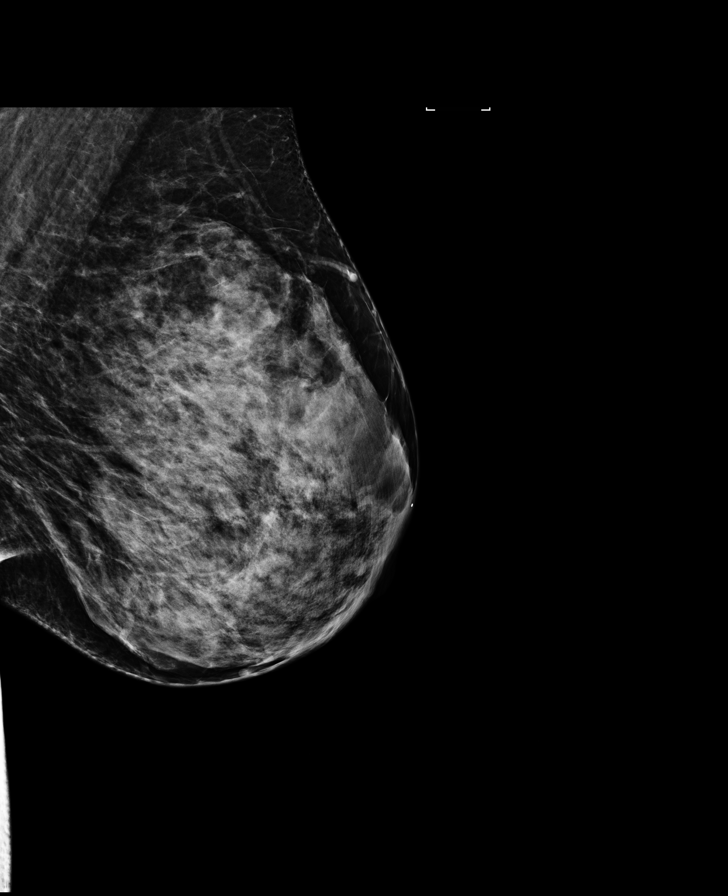

[L CC synth-2D (2 of 2)]
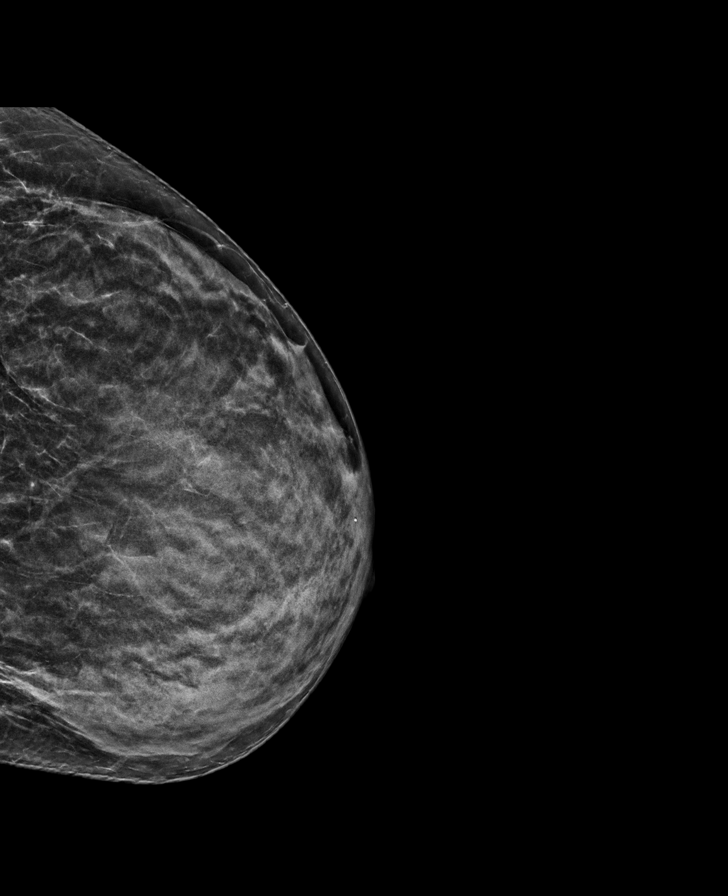

[R MLO synth-2D]
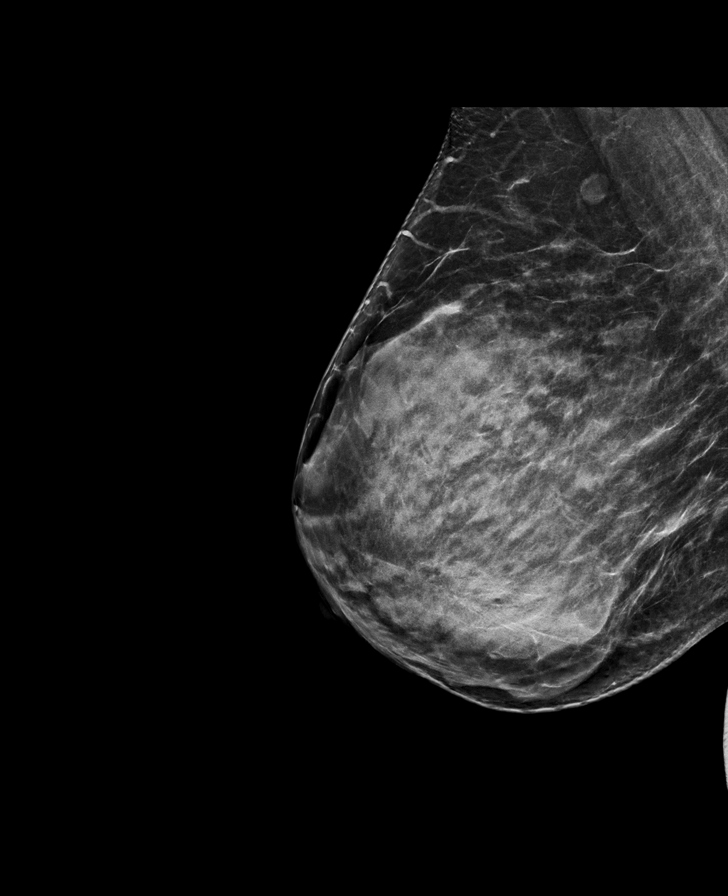

[8 of 35 positions shown; findings below may reference images not displayed]

ACR Breast Density Category d: The breast tissue is extremely dense,
which lowers the sensitivity of mammography.
FINDINGS: No new or suspicious abnormality is identified within either breast.

Mammographic images were processed with CAD.

Targeted ultrasound of the upper, outer left breast was performed
demonstrating an oval, circumscribed, hypoechoic mass at 1 o'clock,
7 cm from the nipple measuring 8 x 7 x 9 mm, decreased in size from
prior exams, consistent with a benign etiology, likely a
fibroadenoma.
IMPRESSION: No mammographic or sonographic evidence of malignancy.

RECOMMENDATION:
Screening mammogram in one year.(Code:[id number]-[id number])

I have discussed the findings and recommendations with the patient.
Results were also provided in writing at the conclusion of the
visit. If applicable, a reminder letter will be sent to the patient
regarding the next appointment.

BI-RADS CATEGORY  2: Benign.

## 2019-07-19 ENCOUNTER — Other Ambulatory Visit: Payer: Self-pay | Admitting: Obstetrics and Gynecology

## 2019-07-19 DIAGNOSIS — R928 Other abnormal and inconclusive findings on diagnostic imaging of breast: Secondary | ICD-10-CM

## 2019-07-31 ENCOUNTER — Ambulatory Visit
Admission: RE | Admit: 2019-07-31 | Discharge: 2019-07-31 | Disposition: A | Discharge status: Home / Self Care | Payer: 59 | Source: Ambulatory Visit | Attending: Obstetrics and Gynecology | Admitting: Obstetrics and Gynecology

## 2019-07-31 ENCOUNTER — Ambulatory Visit: Payer: 59

## 2019-07-31 ENCOUNTER — Other Ambulatory Visit: Payer: Self-pay

## 2019-07-31 DIAGNOSIS — R928 Other abnormal and inconclusive findings on diagnostic imaging of breast: Secondary | ICD-10-CM

## 2020-09-16 DIAGNOSIS — Z01419 Encounter for gynecological examination (general) (routine) without abnormal findings: Secondary | ICD-10-CM | POA: Diagnosis not present

## 2020-09-16 DIAGNOSIS — N920 Excessive and frequent menstruation with regular cycle: Secondary | ICD-10-CM | POA: Diagnosis not present

## 2020-09-16 DIAGNOSIS — Z6835 Body mass index (BMI) 35.0-35.9, adult: Secondary | ICD-10-CM | POA: Diagnosis not present

## 2020-09-16 DIAGNOSIS — Z1231 Encounter for screening mammogram for malignant neoplasm of breast: Secondary | ICD-10-CM | POA: Diagnosis not present

## 2020-11-06 DIAGNOSIS — F411 Generalized anxiety disorder: Secondary | ICD-10-CM | POA: Diagnosis not present

## 2020-11-06 DIAGNOSIS — E6609 Other obesity due to excess calories: Secondary | ICD-10-CM | POA: Diagnosis not present

## 2020-11-06 DIAGNOSIS — R030 Elevated blood-pressure reading, without diagnosis of hypertension: Secondary | ICD-10-CM | POA: Diagnosis not present

## 2020-12-07 DIAGNOSIS — Z20822 Contact with and (suspected) exposure to covid-19: Secondary | ICD-10-CM | POA: Diagnosis not present

## 2020-12-12 ENCOUNTER — Other Ambulatory Visit: Payer: Self-pay | Admitting: Obstetrics and Gynecology

## 2021-01-18 ENCOUNTER — Other Ambulatory Visit: Payer: Self-pay

## 2021-01-18 ENCOUNTER — Encounter (HOSPITAL_BASED_OUTPATIENT_CLINIC_OR_DEPARTMENT_OTHER): Payer: Self-pay | Admitting: Obstetrics and Gynecology

## 2021-01-18 DIAGNOSIS — N809 Endometriosis, unspecified: Secondary | ICD-10-CM

## 2021-01-18 HISTORY — DX: Endometriosis, unspecified: N80.9

## 2021-01-18 NOTE — Progress Notes (Signed)
YOU ARE SCHEDULED FOR A COVID TEST ON   01-25-2021 at 40 AM.  THIS TEST MUST BE DONE BEFORE SURGERY. GO TO  [place]. [place], [place], IT IS APPROXIMATELY 2 MINUTES PAST [employer] ON THE RIGHT AND REMAIN IN YOUR CAR, THIS IS A DRIVE UP TEST.       Your procedure is scheduled on 01-27-2021  Report to [facility].   Call this number if you have problems the morning of surgery  :[phone].   OUR ADDRESS IS [address].  WE ARE LOCATED IN [facility].  PLEASE BRING YOUR INSURANCE CARD AND PHOTO ID DAY OF SURGERY.  ONLY ONE PERSON ALLOWED IN FACILITY WAITING AREA.                                     REMEMBER:  DO NOT EAT FOOD, CANDY GUM OR MINTS  AFTER MIDNIGHT . YOU MAY HAVE CLEAR LIQUIDS FROM MIDNIGHT UNTIL  430 AM . NO CLEAR LIQUIDS AFTER 430 AM DAY OF SURGERY.   YOU MAY  BRUSH YOUR TEETH MORNING OF SURGERY AND RINSE YOUR MOUTH OUT, NO CHEWING GUM CANDY OR MINTS.    CLEAR LIQUID DIET   Foods Allowed                                                                     Foods Excluded  Coffee and tea, regular and decaf                             liquids that you cannot  Plain Jell-O any favor except red or purple                                           see through such as: Fruit ices (not with fruit pulp)                                     milk, soups, orange juice  Iced Popsicles                                    All solid food Carbonated beverages, regular and diet                                    Cranberry, grape and apple juices Sports drinks like Gatorade Lightly seasoned clear broth or consume(fat free) Sugar, honey syrup  Sample Menu Breakfast                                Lunch  Supper Cranberry juice                    Beef broth                            Chicken broth Jell-O                                     Grape juice                            Apple juice Coffee or tea                        Jell-O                                      Popsicle                                                Coffee or tea                        Coffee or tea  _____________________________________________________________________     TAKE THESE MEDICATIONS MORNING OF SURGERY WITH A SIP OF WATER:   FLUOXETINE, JUNEL  ONE VISITOR IS ALLOWED IN WAITING ROOM ONLY DAY OF SURGERY.  NO VISITOR MAY SPEND THE NIGHT.  VISITOR ARE ALLOWED TO STAY UNTIL 800 PM.                                    DO NOT WEAR JEWERLY, MAKE UP. DO NOT WEAR LOTIONS, POWDERS, PERFUMES OR NAIL POLISH. DO NOT SHAVE FOR 48 HOURS PRIOR TO DAY OF SURGERY. MEN MAY SHAVE FACE AND NECK. CONTACTS, GLASSES, OR DENTURES MAY NOT BE WORN TO SURGERY.                                    [facility] IS NOT RESPONSIBLE  FOR ANY BELONGINGS.                                                                    Marland Kitchen           [facility] - Preparing for Surgery Before surgery, you can play an important role.  Because skin is not sterile, your skin needs to be as free of germs as possible.  You can reduce the number of germs on your skin by washing with CHG (chlorahexidine gluconate) soap before surgery.  CHG is an antiseptic cleaner which kills germs and bonds with the skin to continue killing germs even after washing. Please DO NOT use if you have an allergy to CHG or antibacterial soaps.  If your skin  becomes reddened/irritated stop using the CHG and inform your nurse when you arrive at Short Stay. Do not shave (including legs and underarms) for at least 48 hours prior to the first CHG shower.  You may shave your face/neck. Please follow these instructions carefully:  1.  Shower with CHG Soap the night before surgery and the  morning of Surgery.  2.  If you choose to wash your hair, wash your hair first as usual with your  normal  shampoo.  3.  After you shampoo, rinse your hair and body thoroughly to remove  the  shampoo.                            4.  Use CHG as you would any other liquid soap.  You can apply chg directly  to the skin and wash                      Gently with a scrungie or clean washcloth.  5.  Apply the CHG Soap to your body ONLY FROM THE NECK DOWN.   Do not use on face/ open                           Wound or open sores. Avoid contact with eyes, ears mouth and genitals (private parts).                       Wash face,  Genitals (private parts) with your normal soap.             6.  Wash thoroughly, paying special attention to the area where your surgery  will be performed.  7.  Thoroughly rinse your body with warm water from the neck down.  8.  DO NOT shower/wash with your normal soap after using and rinsing off  the CHG Soap.                9.  Pat yourself dry with a clean towel.            10.  Wear clean pajamas.            11.  Place clean sheets on your bed the night of your first shower and do not  sleep with pets. Day of Surgery : Do not apply any lotions/deodorants the morning of surgery.  Please wear clean clothes to the hospital/surgery center.  FAILURE TO FOLLOW THESE INSTRUCTIONS MAY RESULT IN THE CANCELLATION OF YOUR SURGERY PATIENT SIGNATURE_________________________________  NURSE SIGNATURE__________________________________  ________________________________________________________________________                                                        QUESTIONS Hansel Feinstein PRE OP NURSE PHONE [phone].

## 2021-01-18 NOTE — Progress Notes (Addendum)
Spoke w/ via phone for pre-op interview---pt Lab needs dos---- urine preg              Lab results------has lab appt 01-25-2021 900 am for cbc t & s Covid test: 01-25-2021 at 1015 (extended stay) Arrive at -------530 am 01-27-2021 NPO after MN NO Solid Food.  Clear liquids from MN until---430 am then npo Med rec completed Medications to take morning of surgery -----fluoxetine, junel  Diabetic medication -----n/a Patient instructed no nail polish to be worn day of surgery Patient instructed to bring photo id and insurance card day of surgery Patient aware to have Driver (ride ) / caregiver    spouse mike cell [phone] for 24 hours after surgery  Patient Special Instructions -----pt given extended stay instructions Pre-Op special Istructions -----n/a Patient verbalized understanding of instructions that were given at this phone interview. Patient denies shortness of breath, chest pain, fever, cough at this phone interview  lov Jeri Lager fnp 12-29-2016 for  dr Einar Gip on chart Bilateral carotid study 10-20-2017 dr Einar Gip chart Ekg 10-27-2016  dr Einar Gip on chart Echo 08-17-2016 dr Einar Gip  on chart

## 2021-01-25 ENCOUNTER — Other Ambulatory Visit: Payer: Self-pay

## 2021-01-25 ENCOUNTER — Other Ambulatory Visit (HOSPITAL_COMMUNITY)
Admission: RE | Admit: 2021-01-25 | Discharge: 2021-01-25 | Disposition: A | Discharge status: Home / Self Care | Payer: BC Managed Care – PPO | Source: Ambulatory Visit | Attending: Obstetrics and Gynecology | Admitting: Obstetrics and Gynecology

## 2021-01-25 ENCOUNTER — Encounter (HOSPITAL_COMMUNITY)
Admission: RE | Admit: 2021-01-25 | Discharge: 2021-01-25 | Disposition: A | Discharge status: Home / Self Care | Payer: BC Managed Care – PPO | Source: Ambulatory Visit | Attending: Obstetrics and Gynecology | Admitting: Obstetrics and Gynecology

## 2021-01-25 DIAGNOSIS — Z01812 Encounter for preprocedural laboratory examination: Secondary | ICD-10-CM | POA: Diagnosis not present

## 2021-01-25 DIAGNOSIS — N920 Excessive and frequent menstruation with regular cycle: Secondary | ICD-10-CM | POA: Diagnosis not present

## 2021-01-25 DIAGNOSIS — Z20822 Contact with and (suspected) exposure to covid-19: Secondary | ICD-10-CM | POA: Diagnosis not present

## 2021-01-25 DIAGNOSIS — Z87891 Personal history of nicotine dependence: Secondary | ICD-10-CM | POA: Diagnosis not present

## 2021-01-25 DIAGNOSIS — N800 Endometriosis of uterus: Secondary | ICD-10-CM | POA: Diagnosis not present

## 2021-01-25 DIAGNOSIS — D259 Leiomyoma of uterus, unspecified: Secondary | ICD-10-CM | POA: Diagnosis not present

## 2021-01-25 DIAGNOSIS — Z9889 Other specified postprocedural states: Secondary | ICD-10-CM | POA: Diagnosis not present

## 2021-01-25 DIAGNOSIS — Z79899 Other long term (current) drug therapy: Secondary | ICD-10-CM | POA: Diagnosis not present

## 2021-01-25 LAB — CBC
HCT: 43.6 % (ref 36.0–46.0)
Hemoglobin: 14.4 g/dL (ref 12.0–15.0)
MCH: 29.2 pg (ref 26.0–34.0)
MCHC: 33 g/dL (ref 30.0–36.0)
MCV: 88.4 fL (ref 80.0–100.0)
Platelets: 308 10*3/uL (ref 150–400)
RBC: 4.93 MIL/uL (ref 3.87–5.11)
RDW: 13.2 % (ref 11.5–15.5)
WBC: 9.1 10*3/uL (ref 4.0–10.5)
nRBC: 0 % (ref 0.0–0.2)

## 2021-01-25 LAB — SARS CORONAVIRUS 2 (TAT 6-24 HRS): SARS Coronavirus 2: NEGATIVE

## 2021-01-26 NOTE — H&P (Signed)
50 y.o. G3P2 with over a year's history of heavy bleeding unresponsive to medical therapy.  From chart: "Pt. here for pre op visit for TLH/b-salpingectomy, scheduled on 07/27/tb//  Previously:"WWE. Mammo, no pap. Irregular bleeding: pt not having sex recently; secondary the relationship. Thinking about ablation. Pt with anemia and had heavy bleeding last week even on OCPs. Will check CBC in case. Pt still needs PCP to watch BP.  In 2018 though had US pelvic 5x5x5; EM 0.3 cm; adenomyosis appearance of uterus. Ovaries normal. No FF. May do better with robo hyst with salpingectomy. Pt to call with decision and may schedule surgery out mid to late summer. "  With adeno and excessive bleeding, pt desires TLH."   Past Medical History:  Diagnosis Date   Anemia    Hx of anemia   Anxiety    takes prozac   Arthritis    left knee   Endometriosis 01/18/2021   Heart murmur 12/29/2016   2 mumurs f/u in 5 years saw dr Einar Gip for 12-29-2016   History of gestational diabetes 2006   History of kidney stones    PONV (postoperative nausea and vomiting)    likes scopolamine patch   Past Surgical History:  Procedure Laterality Date   CESAREAN SECTION  11/20/2005   CYSTO/  LEFT RETROGRADE PYELOGRAM/  STENT PLACEMENT  11/04/2008   left   CYSTOSCOPY W/ URETERAL STENT PLACEMENT Right 07/16/2014   Procedure: CYSTOSCOPY WITH RETROGRADE PYELOGRAM/URETERAL STENT PLACEMENT;  Surgeon: Bernestine Amass, MD;  Location: [facility];  Service: Urology;  Laterality: Right;   EXTRACORPOREAL SHOCK WAVE LITHOTRIPSY  07/04/2008   one 2017 right   KNEE ARTHROSCOPY Left 07/23/2018   Procedure: ARTHROSCOPY LEFT KNEE WITH CHONDROPLASTY, MANIPULATION WITH LYSIS OF ADHESIONS;  Surgeon: Hiram Gash, MD;  Location: [place];  Service: Orthopedics;  Laterality: Left;   RIGHT SUPERIOR PARATHYROIDECTOMY  02/26/2009    Social History   Socioeconomic History   Marital status: Married    Spouse  name: Not on file   Number of children: Not on file   Years of education: Not on file   Highest education level: Not on file  Occupational History   Not on file  Tobacco Use   Smoking status: Former    Packs/day: 2.00    Years: 10.00    Pack years: 20.00    Types: Cigarettes    Quit date: 07/14/2004    Years since quitting: 16.5   Smokeless tobacco: Never  Vaping Use   Vaping Use: Never used  Substance and Sexual Activity   Alcohol use: Not Currently   Drug use: No   Sexual activity: Not on file  Other Topics Concern   Not on file  Social History Narrative   Not on file   Social Determinants of Health   Financial Resource Strain: Not on file  Food Insecurity: Not on file  Transportation Needs: Not on file  Physical Activity: Not on file  Stress: Not on file  Social Connections: Not on file  Intimate Partner Violence: Not on file    No current facility-administered medications on file prior to encounter.   Current Outpatient Medications on File Prior to Encounter  Medication Sig Dispense Refill   diphenhydrAMINE (BENADRYL) 25 MG tablet Take 25 mg by mouth every 6 (six) hours as needed.     FLUoxetine (PROZAC) 40 MG capsule Take 40 mg by mouth daily.     ibuprofen (ADVIL) 200 MG tablet Take 400  mg by mouth every 6 (six) hours as needed.     norethindrone-ethinyl estradiol-FE (LOESTRIN FE) 1-20 MG-MCG tablet Take 1 tablet by mouth daily. junel      No Known Allergies  Vitals:   01/18/21 1212  Weight: 99.8 kg  Height: '5\' 7"'$  (1.702 m)    Lungs: clear to ascultation Cor:  RRR Abdomen:  soft, nontender, nondistended. Ex:  no cords, erythema Pelvic:   Vulva: no masses, no atrophy, no lesions Vagina: no tenderness, no erythema, no abnormal vaginal discharge, no vesicle(s) or ulcers, no cystocele, no rectocele Cervix: grossly normal, no discharge, no cervical motion tenderness Uterus: normal size (7), normal shape, midline, no uterine prolapse,  non-tender Bladder/Urethra: normal meatus, no urethral discharge, no urethral mass, bladder non distended, Urethra well supported Adnexa/Parametria: no parametrial tenderness, no parametrial mass, no adnexal tenderness, no ovarian mass   A:  Likely adenomyosis with menorrhagia.  For Robotic TLH/salpingectomies/cysto.   P: P: All risks, benefits and alternatives d/w patient and she desires to proceed.  Patient has undergone a modified diet, ERAS protocol and will receive preop antibiotics and SCDs during the operation.   Pt to have extended recovery but will go home same day if eating, ambulating, voiding and pain control is good.  Daria Pastures

## 2021-01-26 NOTE — Anesthesia Preprocedure Evaluation (Addendum)
Anesthesia Evaluation  Patient identified by MRN, date of birth, ID band Patient awake    Reviewed: Allergy & Precautions, NPO status , Patient's Chart, lab work & pertinent test results  History of Anesthesia Complications (+) PONV and history of anesthetic complications  Airway Mallampati: II  TM Distance: >3 FB Neck ROM: Full    Dental no notable dental hx. (+) Teeth Intact, Caps, Dental Advisory Given   Pulmonary former smoker,    Pulmonary exam normal breath sounds clear to auscultation       Cardiovascular Normal cardiovascular exam+ Valvular Problems/Murmurs  Rhythm:Regular Rate:Normal     Neuro/Psych Anxiety negative neurological ROS     GI/Hepatic negative GI ROS, Neg liver ROS,   Endo/Other  negative endocrine ROS  Renal/GU Renal disease  negative genitourinary   Musculoskeletal negative musculoskeletal ROS (+) Arthritis ,   Abdominal (+) + obese,   Peds negative pediatric ROS (+)  Hematology negative hematology ROS (+) anemia ,   Anesthesia Other Findings   Reproductive/Obstetrics negative OB ROS                            Anesthesia Physical  Anesthesia Plan  ASA: 3  Anesthesia Plan: General   Post-op Pain Management:    Induction: Intravenous  PONV Risk Score and Plan: 4 or greater and Ondansetron, Dexamethasone, Midazolam, Scopolamine patch - Pre-op and Propofol infusion  Airway Management Planned: Oral ETT  Additional Equipment: None  Intra-op Plan:   Post-operative Plan: Extubation in OR  Informed Consent: I have reviewed the patients History and Physical, chart, labs and discussed the procedure including the risks, benefits and alternatives for the proposed anesthesia with the patient or authorized representative who has indicated his/her understanding and acceptance.     Dental advisory given  Plan Discussed with: CRNA and  Anesthesiologist  Anesthesia Plan Comments:        Anesthesia Quick Evaluation

## 2021-01-27 ENCOUNTER — Ambulatory Visit (HOSPITAL_BASED_OUTPATIENT_CLINIC_OR_DEPARTMENT_OTHER): Payer: BC Managed Care – PPO | Admitting: Anesthesiology

## 2021-01-27 ENCOUNTER — Encounter (HOSPITAL_BASED_OUTPATIENT_CLINIC_OR_DEPARTMENT_OTHER): Payer: Self-pay | Admitting: Obstetrics and Gynecology

## 2021-01-27 ENCOUNTER — Other Ambulatory Visit: Payer: Self-pay

## 2021-01-27 ENCOUNTER — Ambulatory Visit (HOSPITAL_BASED_OUTPATIENT_CLINIC_OR_DEPARTMENT_OTHER)
Admission: RE | Admit: 2021-01-27 | Discharge: 2021-01-27 | Disposition: A | Discharge status: Home / Self Care | Payer: BC Managed Care – PPO | Attending: Obstetrics and Gynecology | Admitting: Obstetrics and Gynecology

## 2021-01-27 ENCOUNTER — Encounter (HOSPITAL_BASED_OUTPATIENT_CLINIC_OR_DEPARTMENT_OTHER)
Admission: RE | Disposition: A | Discharge status: Home / Self Care | Payer: Self-pay | Source: Home / Self Care | Attending: Obstetrics and Gynecology

## 2021-01-27 DIAGNOSIS — Z79899 Other long term (current) drug therapy: Secondary | ICD-10-CM | POA: Insufficient documentation

## 2021-01-27 DIAGNOSIS — N879 Dysplasia of cervix uteri, unspecified: Secondary | ICD-10-CM | POA: Diagnosis not present

## 2021-01-27 DIAGNOSIS — D259 Leiomyoma of uterus, unspecified: Secondary | ICD-10-CM | POA: Diagnosis not present

## 2021-01-27 DIAGNOSIS — Z87891 Personal history of nicotine dependence: Secondary | ICD-10-CM | POA: Diagnosis not present

## 2021-01-27 DIAGNOSIS — N800 Endometriosis of uterus: Secondary | ICD-10-CM | POA: Diagnosis not present

## 2021-01-27 DIAGNOSIS — N920 Excessive and frequent menstruation with regular cycle: Secondary | ICD-10-CM | POA: Insufficient documentation

## 2021-01-27 DIAGNOSIS — D649 Anemia, unspecified: Secondary | ICD-10-CM | POA: Diagnosis not present

## 2021-01-27 DIAGNOSIS — Z9889 Other specified postprocedural states: Secondary | ICD-10-CM

## 2021-01-27 DIAGNOSIS — N72 Inflammatory disease of cervix uteri: Secondary | ICD-10-CM | POA: Diagnosis not present

## 2021-01-27 DIAGNOSIS — N809 Endometriosis, unspecified: Secondary | ICD-10-CM | POA: Diagnosis not present

## 2021-01-27 DIAGNOSIS — Z20822 Contact with and (suspected) exposure to covid-19: Secondary | ICD-10-CM | POA: Diagnosis not present

## 2021-01-27 DIAGNOSIS — F419 Anxiety disorder, unspecified: Secondary | ICD-10-CM | POA: Diagnosis not present

## 2021-01-27 HISTORY — PX: CYSTOSCOPY: SHX5120

## 2021-01-27 HISTORY — PX: ROBOTIC ASSISTED TOTAL HYSTERECTOMY WITH BILATERAL SALPINGO OOPHERECTOMY: SHX6086

## 2021-01-27 LAB — TYPE AND SCREEN
ABO/RH(D): O POS
Antibody Screen: NEGATIVE

## 2021-01-27 LAB — POCT PREGNANCY, URINE: Preg Test, Ur: NEGATIVE

## 2021-01-27 LAB — ABO/RH: ABO/RH(D): O POS

## 2021-01-27 SURGERY — HYSTERECTOMY, TOTAL, ROBOT-ASSISTED, LAPAROSCOPIC, WITH BILATERAL SALPINGO-OOPHORECTOMY
Anesthesia: General | Site: Bladder

## 2021-01-27 MED ORDER — FENTANYL CITRATE (PF) 100 MCG/2ML IJ SOLN
INTRAMUSCULAR | Status: DC | PRN
  Administered 2021-01-27: 50 ug via INTRAVENOUS
  Administered 2021-01-27: 100 ug via INTRAVENOUS

## 2021-01-27 MED ORDER — ACETAMINOPHEN 500 MG PO TABS
1000.0000 mg | ORAL_TABLET | ORAL | Status: AC
  Administered 2021-01-27: 1000 mg via ORAL

## 2021-01-27 MED ORDER — ACETAMINOPHEN 500 MG PO TABS
ORAL_TABLET | ORAL | Status: AC
  Filled 2021-01-27: qty 2

## 2021-01-27 MED ORDER — OXYCODONE HCL 5 MG/5ML PO SOLN: 5.0000 mg | Freq: Once | ORAL | Status: AC | PRN

## 2021-01-27 MED ORDER — SUGAMMADEX SODIUM 200 MG/2ML IV SOLN
INTRAVENOUS | Status: DC | PRN
  Administered 2021-01-27: 200 mg via INTRAVENOUS

## 2021-01-27 MED ORDER — SODIUM CHLORIDE 0.9 % IV SOLN
INTRAVENOUS | Status: DC | PRN
  Administered 2021-01-27: 60 mL

## 2021-01-27 MED ORDER — ROCURONIUM BROMIDE 10 MG/ML (PF) SYRINGE
PREFILLED_SYRINGE | INTRAVENOUS | Status: DC | PRN
  Administered 2021-01-27: 20 mg via INTRAVENOUS
  Administered 2021-01-27: 60 mg via INTRAVENOUS
  Administered 2021-01-27: 20 mg via INTRAVENOUS

## 2021-01-27 MED ORDER — SODIUM CHLORIDE (PF) 0.9 % IJ SOLN
INTRAMUSCULAR | Status: AC
  Filled 2021-01-27: qty 10

## 2021-01-27 MED ORDER — ONDANSETRON HCL 4 MG/2ML IJ SOLN
INTRAMUSCULAR | Status: DC | PRN
Start: 2021-01-27 — End: 2021-01-27
  Administered 2021-01-27: 4 mg via INTRAVENOUS

## 2021-01-27 MED ORDER — OXYCODONE HCL 5 MG PO TABS
ORAL_TABLET | ORAL | Status: AC
  Filled 2021-01-27: qty 1

## 2021-01-27 MED ORDER — CELECOXIB 200 MG PO CAPS
400.0000 mg | ORAL_CAPSULE | ORAL | Status: AC
  Administered 2021-01-27: 400 mg via ORAL

## 2021-01-27 MED ORDER — IBUPROFEN 200 MG PO TABS: 800.0000 mg | ORAL_TABLET | Freq: Three times a day (TID) | ORAL | 0 refills | Status: DC

## 2021-01-27 MED ORDER — ONDANSETRON 8 MG PO TBDP: 8.0000 mg | ORAL_TABLET | Freq: Three times a day (TID) | ORAL | 0 refills | Status: DC | PRN

## 2021-01-27 MED ORDER — ONDANSETRON HCL 4 MG/2ML IJ SOLN
INTRAMUSCULAR | Status: AC
  Filled 2021-01-27: qty 2

## 2021-01-27 MED ORDER — ONDANSETRON HCL 4 MG PO TABS: 4.0000 mg | ORAL_TABLET | Freq: Four times a day (QID) | ORAL | Status: DC | PRN

## 2021-01-27 MED ORDER — SCOPOLAMINE 1 MG/3DAYS TD PT72
1.0000 | MEDICATED_PATCH | TRANSDERMAL | Status: DC
  Administered 2021-01-27: 1.5 mg via TRANSDERMAL

## 2021-01-27 MED ORDER — KETOROLAC TROMETHAMINE 30 MG/ML IJ SOLN
30.0000 mg | Freq: Four times a day (QID) | INTRAMUSCULAR | Status: DC
  Administered 2021-01-27: 30 mg via INTRAVENOUS

## 2021-01-27 MED ORDER — LACTATED RINGERS IV SOLN: INTRAVENOUS | Status: DC

## 2021-01-27 MED ORDER — FLUOXETINE HCL 40 MG PO CAPS: 40.0000 mg | ORAL_CAPSULE | Freq: Every day | ORAL | Status: DC

## 2021-01-27 MED ORDER — CEFAZOLIN SODIUM 2 G IJ SOLR
2.0000 g | INTRAMUSCULAR | Status: AC
  Administered 2021-01-27: 2 g via INTRAVENOUS

## 2021-01-27 MED ORDER — FENTANYL CITRATE (PF) 250 MCG/5ML IJ SOLN
INTRAMUSCULAR | Status: AC
  Filled 2021-01-27: qty 5

## 2021-01-27 MED ORDER — KETOROLAC TROMETHAMINE 30 MG/ML IJ SOLN
INTRAMUSCULAR | Status: AC
  Filled 2021-01-27: qty 1

## 2021-01-27 MED ORDER — GABAPENTIN 300 MG PO CAPS
ORAL_CAPSULE | ORAL | Status: AC
  Filled 2021-01-27: qty 1

## 2021-01-27 MED ORDER — OXYCODONE-ACETAMINOPHEN 5-325 MG PO TABS: 1.0000 | ORAL_TABLET | ORAL | 0 refills | Status: DC | PRN

## 2021-01-27 MED ORDER — PROPOFOL 500 MG/50ML IV EMUL
INTRAVENOUS | Status: DC | PRN
  Administered 2021-01-27: 200 ug/kg/min via INTRAVENOUS

## 2021-01-27 MED ORDER — ACETAMINOPHEN 325 MG PO TABS: 325.0000 mg | ORAL_TABLET | ORAL | Status: DC | PRN

## 2021-01-27 MED ORDER — POVIDONE-IODINE 10 % EX SWAB
2.0000 "application " | Freq: Once | CUTANEOUS | Status: AC
  Administered 2021-01-27: 2 via TOPICAL

## 2021-01-27 MED ORDER — PROPOFOL 500 MG/50ML IV EMUL
INTRAVENOUS | Status: AC
  Filled 2021-01-27: qty 50

## 2021-01-27 MED ORDER — MIDAZOLAM HCL 2 MG/2ML IJ SOLN
INTRAMUSCULAR | Status: AC
  Filled 2021-01-27: qty 2

## 2021-01-27 MED ORDER — INDIGOTINDISULFONATE SODIUM 8 MG/ML IJ SOLN
INTRAMUSCULAR | Status: DC | PRN
  Administered 2021-01-27: 40 mg via INTRAVENOUS

## 2021-01-27 MED ORDER — ONDANSETRON HCL 4 MG/2ML IJ SOLN
4.0000 mg | Freq: Once | INTRAMUSCULAR | Status: AC | PRN
  Administered 2021-01-27: 4 mg via INTRAVENOUS

## 2021-01-27 MED ORDER — ONDANSETRON HCL 4 MG/2ML IJ SOLN
4.0000 mg | Freq: Four times a day (QID) | INTRAMUSCULAR | Status: DC | PRN
  Administered 2021-01-27: 4 mg via INTRAVENOUS

## 2021-01-27 MED ORDER — OXYCODONE-ACETAMINOPHEN 5-325 MG PO TABS: 1.0000 | ORAL_TABLET | ORAL | Status: DC | PRN

## 2021-01-27 MED ORDER — SODIUM CHLORIDE 0.9 % IV SOLN
INTRAVENOUS | Status: AC
  Filled 2021-01-27: qty 2

## 2021-01-27 MED ORDER — KETOROLAC TROMETHAMINE 15 MG/ML IJ SOLN: 15.0000 mg | INTRAMUSCULAR | Status: DC

## 2021-01-27 MED ORDER — MENTHOL 3 MG MT LOZG: 1.0000 | LOZENGE | OROMUCOSAL | Status: DC | PRN

## 2021-01-27 MED ORDER — CELECOXIB 200 MG PO CAPS
ORAL_CAPSULE | ORAL | Status: AC
  Filled 2021-01-27: qty 2

## 2021-01-27 MED ORDER — FENTANYL CITRATE (PF) 100 MCG/2ML IJ SOLN
INTRAMUSCULAR | Status: AC
  Filled 2021-01-27: qty 2

## 2021-01-27 MED ORDER — DEXAMETHASONE SODIUM PHOSPHATE 10 MG/ML IJ SOLN
INTRAMUSCULAR | Status: AC
  Filled 2021-01-27: qty 1

## 2021-01-27 MED ORDER — PROPOFOL 10 MG/ML IV BOLUS
INTRAVENOUS | Status: DC | PRN
Start: 2021-01-27 — End: 2021-01-27
  Administered 2021-01-27: 150 mg via INTRAVENOUS

## 2021-01-27 MED ORDER — PHENYLEPHRINE 40 MCG/ML (10ML) SYRINGE FOR IV PUSH (FOR BLOOD PRESSURE SUPPORT)
PREFILLED_SYRINGE | INTRAVENOUS | Status: AC
  Filled 2021-01-27: qty 10

## 2021-01-27 MED ORDER — SCOPOLAMINE 1 MG/3DAYS TD PT72
MEDICATED_PATCH | TRANSDERMAL | Status: AC
  Filled 2021-01-27: qty 1

## 2021-01-27 MED ORDER — FENTANYL CITRATE (PF) 100 MCG/2ML IJ SOLN
25.0000 ug | INTRAMUSCULAR | Status: DC | PRN
  Administered 2021-01-27: 50 ug via INTRAVENOUS
  Administered 2021-01-27 (×2): 25 ug via INTRAVENOUS

## 2021-01-27 MED ORDER — MIDAZOLAM HCL 5 MG/5ML IJ SOLN
INTRAMUSCULAR | Status: DC | PRN
  Administered 2021-01-27: 2 mg via INTRAVENOUS

## 2021-01-27 MED ORDER — OXYCODONE HCL 5 MG PO TABS
5.0000 mg | ORAL_TABLET | Freq: Once | ORAL | Status: AC | PRN
  Administered 2021-01-27: 5 mg via ORAL

## 2021-01-27 MED ORDER — MEPERIDINE HCL 25 MG/ML IJ SOLN: 6.2500 mg | INTRAMUSCULAR | Status: DC | PRN

## 2021-01-27 MED ORDER — SODIUM CHLORIDE 0.9 % IR SOLN
Status: DC | PRN
  Administered 2021-01-27: 1000 mL via INTRAVESICAL
  Administered 2021-01-27: 3000 mL

## 2021-01-27 MED ORDER — PHENYLEPHRINE 40 MCG/ML (10ML) SYRINGE FOR IV PUSH (FOR BLOOD PRESSURE SUPPORT)
PREFILLED_SYRINGE | INTRAVENOUS | Status: DC | PRN
  Administered 2021-01-27: 80 ug via INTRAVENOUS

## 2021-01-27 MED ORDER — SENNA 8.6 MG PO TABS: 1.0000 | ORAL_TABLET | Freq: Two times a day (BID) | ORAL | Status: DC

## 2021-01-27 MED ORDER — GABAPENTIN 300 MG PO CAPS
300.0000 mg | ORAL_CAPSULE | ORAL | Status: AC
Start: 2021-01-27 — End: 2021-01-27
  Administered 2021-01-27: 300 mg via ORAL

## 2021-01-27 MED ORDER — PROPOFOL 10 MG/ML IV BOLUS
INTRAVENOUS | Status: AC
  Filled 2021-01-27: qty 20

## 2021-01-27 MED ORDER — DEXAMETHASONE SODIUM PHOSPHATE 10 MG/ML IJ SOLN
INTRAMUSCULAR | Status: DC | PRN
  Administered 2021-01-27: 10 mg via INTRAVENOUS

## 2021-01-27 MED ORDER — DEXMEDETOMIDINE (PRECEDEX) IN NS 20 MCG/5ML (4 MCG/ML) IV SYRINGE
PREFILLED_SYRINGE | INTRAVENOUS | Status: AC
  Filled 2021-01-27: qty 5

## 2021-01-27 MED ORDER — IBUPROFEN 800 MG PO TABS: 800.0000 mg | ORAL_TABLET | Freq: Three times a day (TID) | ORAL | 0 refills | Status: DC

## 2021-01-27 MED ORDER — KETAMINE HCL 10 MG/ML IJ SOLN
INTRAMUSCULAR | Status: DC | PRN
  Administered 2021-01-27: 40 mg via INTRAVENOUS

## 2021-01-27 MED ORDER — ACETAMINOPHEN 160 MG/5ML PO SOLN: 325.0000 mg | ORAL | Status: DC | PRN

## 2021-01-27 MED ORDER — LIDOCAINE 2% (20 MG/ML) 5 ML SYRINGE
INTRAMUSCULAR | Status: DC | PRN
  Administered 2021-01-27: 60 mg via INTRAVENOUS

## 2021-01-27 MED ORDER — SOD CITRATE-CITRIC ACID 500-334 MG/5ML PO SOLN: 30.0000 mL | ORAL | Status: DC

## 2021-01-27 MED ORDER — IBUPROFEN 800 MG PO TABS: 800.0000 mg | ORAL_TABLET | Freq: Three times a day (TID) | ORAL | Status: DC

## 2021-01-27 MED ORDER — KETAMINE HCL 50 MG/5ML IJ SOSY
PREFILLED_SYRINGE | INTRAMUSCULAR | Status: AC
  Filled 2021-01-27: qty 5

## 2021-01-27 MED ORDER — ROCURONIUM BROMIDE 10 MG/ML (PF) SYRINGE
PREFILLED_SYRINGE | INTRAVENOUS | Status: AC
  Filled 2021-01-27: qty 10

## 2021-01-27 SURGICAL SUPPLY — 62 items
ADH SKN CLS APL DERMABOND .7 (GAUZE/BANDAGES/DRESSINGS) ×2
BARRIER ADHS 3X4 INTERCEED (GAUZE/BANDAGES/DRESSINGS) IMPLANT
BRR ADH 4X3 ABS CNTRL BYND (GAUZE/BANDAGES/DRESSINGS)
COVER BACK TABLE 60X90IN (DRAPES) ×3 IMPLANT
COVER TIP SHEARS 8 DVNC (MISCELLANEOUS) ×2 IMPLANT
COVER TIP SHEARS 8MM DA VINCI (MISCELLANEOUS) ×3
DECANTER SPIKE VIAL GLASS SM (MISCELLANEOUS) IMPLANT
DEFOGGER SCOPE WARMER CLEARIFY (MISCELLANEOUS) ×3 IMPLANT
DERMABOND ADVANCED (GAUZE/BANDAGES/DRESSINGS) ×1
DERMABOND ADVANCED .7 DNX12 (GAUZE/BANDAGES/DRESSINGS) ×2 IMPLANT
DRAPE ARM DVNC X/XI (DISPOSABLE) ×8 IMPLANT
DRAPE COLUMN DVNC XI (DISPOSABLE) ×2 IMPLANT
DRAPE DA VINCI XI ARM (DISPOSABLE) ×12
DRAPE DA VINCI XI COLUMN (DISPOSABLE) ×3
DRAPE ROBOTICS STRL (DRAPES) ×3 IMPLANT
DRAPE UTILITY XL STRL (DRAPES) ×3 IMPLANT
DURAPREP 26ML APPLICATOR (WOUND CARE) ×3 IMPLANT
ELECT REM PT RETURN 9FT ADLT (ELECTROSURGICAL) ×3
ELECTRODE REM PT RTRN 9FT ADLT (ELECTROSURGICAL) ×2 IMPLANT
GAUZE 4X4 16PLY ~~LOC~~+RFID DBL (SPONGE) ×6 IMPLANT
GLOVE SRG 8 PF TXTR STRL LF DI (GLOVE) ×2 IMPLANT
GLOVE SURG ENC MOIS LTX SZ6 (GLOVE) ×3 IMPLANT
GLOVE SURG ENC MOIS LTX SZ7 (GLOVE) ×15 IMPLANT
GLOVE SURG UNDER POLY LF SZ6 (GLOVE) IMPLANT
GLOVE SURG UNDER POLY LF SZ6.5 (GLOVE) ×6 IMPLANT
GLOVE SURG UNDER POLY LF SZ7 (GLOVE) ×12 IMPLANT
GLOVE SURG UNDER POLY LF SZ8 (GLOVE) ×3
HOLDER FOLEY CATH W/STRAP (MISCELLANEOUS) IMPLANT
IRRIG SUCT STRYKERFLOW 2 WTIP (MISCELLANEOUS) ×3
IRRIGATION SUCT STRKRFLW 2 WTP (MISCELLANEOUS) ×2 IMPLANT
KIT TURNOVER CYSTO (KITS) ×3 IMPLANT
LEGGING LITHOTOMY PAIR STRL (DRAPES) ×3 IMPLANT
MANIPULATOR ADVINCU DEL 2.5 PL (MISCELLANEOUS) ×3 IMPLANT
MANIPULATOR ADVINCU DEL 3.0 PL (MISCELLANEOUS) IMPLANT
MANIPULATOR ADVINCU DEL 3.5 PL (MISCELLANEOUS) IMPLANT
MANIPULATOR ADVINCU DEL 4.0 PL (MISCELLANEOUS) IMPLANT
NEEDLE INSUFFLATION 120MM (ENDOMECHANICALS) ×3 IMPLANT
OBTURATOR OPTICAL STANDARD 8MM (TROCAR) ×3
OBTURATOR OPTICAL STND 8 DVNC (TROCAR) ×2
OBTURATOR OPTICALSTD 8 DVNC (TROCAR) ×2 IMPLANT
PACK ROBOT WH (CUSTOM PROCEDURE TRAY) ×3 IMPLANT
PACK ROBOTIC GOWN (GOWN DISPOSABLE) ×3 IMPLANT
PACK TRENDGUARD 450 HYBRID PRO (MISCELLANEOUS) ×2 IMPLANT
PAD OB MATERNITY 4.3X12.25 (PERSONAL CARE ITEMS) ×3 IMPLANT
PAD PREP 24X48 CUFFED NSTRL (MISCELLANEOUS) ×3 IMPLANT
POUCH LAPAROSCOPIC INSTRUMENT (MISCELLANEOUS) IMPLANT
PROTECTOR NERVE ULNAR (MISCELLANEOUS) ×6 IMPLANT
SEAL CANN UNIV 5-8 DVNC XI (MISCELLANEOUS) ×6 IMPLANT
SEAL XI 5MM-8MM UNIVERSAL (MISCELLANEOUS) ×9
SET IRRIG Y TYPE TUR BLADDER L (SET/KITS/TRAYS/PACK) ×3 IMPLANT
SET TRI-LUMEN FLTR TB AIRSEAL (TUBING) ×3 IMPLANT
SPONGE T-LAP 4X18 ~~LOC~~+RFID (SPONGE) IMPLANT
SUT VIC AB 0 CT1 36 (SUTURE) ×3 IMPLANT
SUT VICRYL RAPIDE 3 0 (SUTURE) ×6 IMPLANT
SUT VLOC 180 0 9IN  GS21 (SUTURE) ×3
SUT VLOC 180 0 9IN GS21 (SUTURE) ×2 IMPLANT
TOWEL OR 17X26 10 PK STRL BLUE (TOWEL DISPOSABLE) ×3 IMPLANT
TRAY FOLEY W/BAG SLVR 14FR LF (SET/KITS/TRAYS/PACK) ×3 IMPLANT
TRENDGUARD 450 HYBRID PRO PACK (MISCELLANEOUS) ×3
TROCAR BLADELESS OPT 5 100 (ENDOMECHANICALS) IMPLANT
TROCAR PORT AIRSEAL 5X120 (TROCAR) ×3 IMPLANT
WATER STERILE IRR 1000ML POUR (IV SOLUTION) IMPLANT

## 2021-01-27 NOTE — Transfer of Care (Signed)
Immediate Anesthesia Transfer of Care Note  Patient: Emily Berg  Procedure(s) Performed: XI ROBOTIC ASSISTED TOTAL HYSTERECTOMY WITH BILATERAL SALPINGECTOMY (Bilateral: Abdomen) CYSTOSCOPY (Bladder)  Patient Location: PACU  Anesthesia Type:General  Level of Consciousness: awake, alert , oriented and patient cooperative  Airway & Oxygen Therapy: Patient Spontanous Breathing  Post-op Assessment: Report given to RN and Post -op Vital signs reviewed and stable  Post vital signs: Reviewed and stable  Last Vitals:  Vitals Value Taken Time  BP 164/97 01/27/21 0920  Temp    Pulse 85 01/27/21 0923  Resp 16 01/27/21 0923  SpO2 91 % 01/27/21 0923  Vitals shown include unvalidated device data.  Last Pain:  Vitals:   01/27/21 0610  TempSrc: Oral  PainSc: 0-No pain      Patients Stated Pain Goal: 6 ([id number])  Complications: No notable events documented.

## 2021-01-27 NOTE — Progress Notes (Signed)
There has been no change in the patients history, status or exam since the history and physical.  Vitals:   01/18/21 1212 01/27/21 0610  BP:  (!) 172/88  Pulse:  97  Resp:  16  Temp:  99 F (37.2 C)  TempSrc:  Oral  SpO2:  99%  Weight: 99.8 kg 99.8 kg  Height: '5\' 7"'$  (1.702 m) '5\' 7"'$  (1.702 m)    Results for orders placed or performed during the hospital encounter of 01/27/21 (from the past 72 hour(s))  Pregnancy, urine POC     Status: None   Collection Time: 01/27/21  5:52 AM  Result Value Ref Range   Preg Test, Ur NEGATIVE NEGATIVE    Comment:        THE SENSITIVITY OF THIS METHODOLOGY IS >24 mIU/mL     Emily Berg

## 2021-01-27 NOTE — Brief Op Note (Signed)
01/27/2021  9:08 AM  PATIENT:  Emily Berg  50 y.o. female  PRE-OPERATIVE DIAGNOSIS:  Endometriosis  POST-OPERATIVE DIAGNOSIS:  Endometriosis  PROCEDURE:  Procedure(s): XI ROBOTIC ASSISTED TOTAL HYSTERECTOMY WITH BILATERAL SALPINGECTOMY (Bilateral) CYSTOSCOPY (N/A)  SURGEON:  Surgeon(s) and Role:    * Bobbye Charleston, MD - Primary    * Rowland Lathe, MD - Assisting  ANESTHESIA:   general  EBL:  100 mL   LOCAL MEDICATIONS USED:  OTHER Ropivicaine  SPECIMEN:  Source of Specimen:  uterus, cervix, bilateral tubes  DISPOSITION OF SPECIMEN:  PATHOLOGY  COUNTS:  YES  TOURNIQUET:  * No tourniquets in log *  DICTATION: .Note written in EPIC  PLAN OF CARE: Admit for overnight observation  PATIENT DISPOSITION:  PACU - hemodynamically stable.   Delay start of Pharmacological VTE agent (>24hrs) due to surgical blood loss or risk of bleeding: not applicable

## 2021-01-27 NOTE — Discharge Summary (Addendum)
Physician Discharge Summary  Patient ID: Emily Berg MRN: [id number] DOB/AGE: 49-Feb-1972 49 y.o.  Admit date: 01/27/2021 Discharge date: 01/27/2021  Admission Diagnoses:menorrhagia  Discharge Diagnoses: same Active Problems:   Postoperative state   Discharged Condition: good  Hospital Course: Uncomplicated robotic TLH, salpingectomies    Consults: None  Significant Diagnostic Studies: none  Treatments: surgery: Uncomplicated robotic TLH, salpingectomies    Discharge Exam: Blood pressure (!) 153/91, pulse 99, temperature 98 F (36.7 C), resp. rate 14, height '5\' 7"'$  (1.702 m), weight 99.8 kg, SpO2 96 %.   Disposition:    Allergies as of 01/27/2021   No Known Allergies      Medication List     TAKE these medications    diphenhydrAMINE 25 MG tablet Commonly known as: BENADRYL Take 25 mg by mouth every 6 (six) hours as needed.   FLUoxetine 40 MG capsule Commonly known as: PROZAC Take 40 mg by mouth daily.   ibuprofen 200 MG tablet Commonly known as: ADVIL Take 4 tablets (800 mg total) by mouth 3 (three) times daily. What changed:  how much to take when to take this reasons to take this   ibuprofen 800 MG tablet Commonly known as: ADVIL Take 1 tablet (800 mg total) by mouth 3 (three) times daily. What changed: You were already taking a medication with the same name, and this prescription was added. Make sure you understand how and when to take each.   norethindrone-ethinyl estradiol-FE 1-20 MG-MCG tablet Commonly known as: LOESTRIN FE Take 1 tablet by mouth daily. junel   ondansetron 8 MG disintegrating tablet Commonly known as: Zofran ODT Take 1 tablet (8 mg total) by mouth every 8 (eight) hours as needed for nausea or vomiting.   oxyCODONE-acetaminophen 5-325 MG tablet Commonly known as: PERCOCET/ROXICET Take 1 tablet by mouth every 4 (four) hours as needed for severe pain.        Follow-up Information     Bobbye Charleston, MD Follow up  in 2 week(s).   Specialty: Obstetrics and Gynecology Contact information: [address] [place] [address] [phone]                 Signed: Daria Pastures 01/27/2021, 1:56 PM

## 2021-01-27 NOTE — Discharge Instructions (Signed)

## 2021-01-27 NOTE — Progress Notes (Signed)
Patient still having vomiting.  She insist on going home, She states "I can throw up at home just as well as I can here."  Movement seems to precipitate the nausea.  RN will discharge her home to the care of her husband.

## 2021-01-27 NOTE — Progress Notes (Signed)
Patient ambulated from the stretcher in the hallway to her bed.

## 2021-01-27 NOTE — Op Note (Signed)
01/27/2021  9:08 AM  PATIENT:  Emily Berg  50 y.o. female  PRE-OPERATIVE DIAGNOSIS:  Endometriosis  POST-OPERATIVE DIAGNOSIS:  Endometriosis  PROCEDURE:  Procedure(s): XI ROBOTIC ASSISTED TOTAL HYSTERECTOMY WITH BILATERAL SALPINGECTOMY (Bilateral) CYSTOSCOPY (N/A)  SURGEON:  Surgeon(s) and Role:    * Bobbye Charleston, MD - Primary    * Rowland Lathe, MD - Assisting  ANESTHESIA:   general  EBL:  100 mL   LOCAL MEDICATIONS USED:  OTHER Ropivicaine  SPECIMEN:  Source of Specimen:  uterus, cervix, bilateral tubes  DISPOSITION OF SPECIMEN:  PATHOLOGY  COUNTS:  YES  TOURNIQUET:  * No tourniquets in log *  DICTATION: .Note written in EPIC  PLAN OF CARE: Admit for overnight observation  PATIENT DISPOSITION:  PACU - hemodynamically stable.   Delay start of Pharmacological VTE agent (>24hrs) due to surgical blood loss or risk of bleeding: not applicable Complications:  None.  Findings:  7 weeks size uterus.  Ovaries were normal.  The ureters were identified during multiple points of the case and were always out of the field of dissection.  On cystoscopy, the bladder was intact and bilateral spill was seen from each ureteral orriface.    Medications:  Ancef.  Ropivicaine.      Technique:   After adequate anesthesia was achieved the patient was positioned, prepped and draped in usual sterile fashion.  A speculum was placed in the vagina and the cervix dilated with pratt dilators.  The 2.5 cm Koh ring Advincula was assembled and placed in proper fashion.  The  Speculum was removed and the bladder catheterized with a foley.     Attention was turned to the abdomen where a 1 cm incision was made 1 cm above the umbilicus.  The veress needle was introduced without aspiration of bowel contents or blood and the abdomen insufflated. The 8.5 mm Robotic trocar was placed and the other three trocar sites were marked out, all approximately 10 cm from each other and the camera.   Two 8.5 mm trocars were placed on either side of the camera port and a 5 mm assistant port was placed 3 cm above the line of the other trocars.  All trocars were inserted under direct visualization of the camera.  The patient was placed in trendelenburg and then the Robot docked.  The fenestrated bipolar were placed on arm 1 and the Hot shears on arm 3 and introduced under direct visualization of the camera.   I then broke scrub and sat down at the console.  The above findings were noted and the ureters identified well out of the field of dissection.  The right fallopian tube was isolated and cauterized with the bipolar.  The Utero-ovarian ligament was then divided with the bipolar cautery and shears.  The posterior broad ligament was then divided with the hot shears until the uterosacral ligament.  The Broad and cardinal ligaments were then cauterized against the cervix to the level of the Koh ring, securing the uterine artery.  Each pedicle was then incised with the shears.  The anterior leaf was then incised at the reflection of the vessico-uterine junction and the lateral bladder retracted inferiorly after the round ligament had been divided with the bipolar forceps.  The left tube was cauterized with the bipolar and divided with the shears;  then the left utero-ovarian ligament divided with the bipolar forceps and the scissors.  The round ligament was divided as well and the posterior leaf of the broad ligament then  divided with the hot shears. The broad and cardinal ligaments were then cauterized on the left in the same way.   At the level of the internal os, the uterine arteries were bilaterally cauterized with the bipolar.  The ureters were identified well out of the field of dissection.     The bladder was then able to be retracted inferiorly and the vesico-uterine fascia was incised in the midline until the bladder was removed one cm below the Koh ring.  The hot shears then circumferentially incised  the vagina at the level of the reflection on the [facility] ring.  Once the uterus and cervix were amputated, cautery was used to insure hemostasis of the cuff.  Once hemostasis was achieved, the scissors were changed to the mega suture cut needle driver and the cuff was closed with a running stitches of 0-vicryl V loc.  Cautery was used to ensure hemostasis of the left pedicles very superficially. The ureters were peristalsing bilaterally well and very lateral to the areas of operation.     The Robot was then undocked and I scrubbed back in.  The needle was removed and Ropivicaine was introduced into the pelvis. The skin incisions were closed with subcuticular stitches of 3-0 vicryl Rapide and Dermabond.  All instruments were removed from the vagina and cystoscopy performed, revealing an intact bladder and vigourous spill of urine from each ureteral orifice.  The cystoscope was removed and the patient taken to the recovery room in stable condition.   Maximiliano Cromartie A

## 2021-01-27 NOTE — Anesthesia Postprocedure Evaluation (Signed)
Anesthesia Post Note  Patient: Designer, multimedia  Procedure(s) Performed: XI ROBOTIC ASSISTED TOTAL HYSTERECTOMY WITH BILATERAL SALPINGECTOMY (Bilateral: Abdomen) CYSTOSCOPY (Bladder)     Patient location during evaluation: PACU Anesthesia Type: General Level of consciousness: awake and alert Pain management: pain level controlled Vital Signs Assessment: post-procedure vital signs reviewed and stable Respiratory status: spontaneous breathing, nonlabored ventilation, respiratory function stable and patient connected to nasal cannula oxygen Cardiovascular status: blood pressure returned to baseline and stable Postop Assessment: no apparent nausea or vomiting Anesthetic complications: no   No notable events documented.  Last Vitals:  Vitals:   01/27/21 1100 01/27/21 1200  BP: (!) 143/92 (!) 153/91  Pulse: 89 99  Resp: 15 14  Temp: 36.6 C 36.7 C  SpO2: 94% 96%    Last Pain:  Vitals:   01/27/21 1200  TempSrc:   PainSc: 4                  Cortlynn Hollinsworth

## 2021-01-27 NOTE — Anesthesia Procedure Notes (Addendum)
Procedure Name: Intubation Date/Time: 01/27/2021 7:35 AM Performed by: Rogers Blocker, CRNA Pre-anesthesia Checklist: Patient identified, Emergency Drugs available, Suction available and Patient being monitored Patient Re-evaluated:Patient Re-evaluated prior to induction Oxygen Delivery Method: Circle System Utilized Preoxygenation: Pre-oxygenation with 100% oxygen Induction Type: IV induction Ventilation: Mask ventilation without difficulty Laryngoscope Size: Miller and 2 Grade View: Grade II Tube type: Oral Number of attempts: 2 (Gr 3 view Mac 3, change to Mil 2, Gr 2 view) Airway Equipment and Method: Stylet Placement Confirmation: ETT inserted through vocal cords under direct vision, positive ETCO2 and breath sounds checked- equal and bilateral Secured at: 22 cm Tube secured with: Tape Dental Injury: Teeth and Oropharynx as per pre-operative assessment

## 2021-01-28 LAB — SURGICAL PATHOLOGY

## 2021-01-29 ENCOUNTER — Encounter (HOSPITAL_BASED_OUTPATIENT_CLINIC_OR_DEPARTMENT_OTHER): Payer: Self-pay | Admitting: Obstetrics and Gynecology

## 2021-02-22 ENCOUNTER — Ambulatory Visit: Payer: BC Managed Care – PPO | Admitting: Cardiology

## 2021-02-22 ENCOUNTER — Encounter: Payer: Self-pay | Admitting: Cardiology

## 2021-02-22 ENCOUNTER — Other Ambulatory Visit: Payer: Self-pay

## 2021-02-22 VITALS — BP 168/98 | HR 88 | Temp 98.7°F | Resp 17 | Ht 67.0 in | Wt 219.0 lb

## 2021-02-22 DIAGNOSIS — R011 Cardiac murmur, unspecified: Secondary | ICD-10-CM | POA: Diagnosis not present

## 2021-02-22 DIAGNOSIS — R030 Elevated blood-pressure reading, without diagnosis of hypertension: Secondary | ICD-10-CM | POA: Diagnosis not present

## 2021-02-22 DIAGNOSIS — R9431 Abnormal electrocardiogram [ECG] [EKG]: Secondary | ICD-10-CM | POA: Diagnosis not present

## 2021-02-22 DIAGNOSIS — R931 Abnormal findings on diagnostic imaging of heart and coronary circulation: Secondary | ICD-10-CM

## 2021-02-22 DIAGNOSIS — Z8249 Family history of ischemic heart disease and other diseases of the circulatory system: Secondary | ICD-10-CM | POA: Diagnosis not present

## 2021-02-22 NOTE — Progress Notes (Signed)
Primary Physician/Referring:  Donald Prose, MD  Patient ID: Emily Berg, female    DOB: 1970/11/08, 50 y.o.   MRN: [id number]  Chief Complaint  Patient presents with   New Patient (Initial Visit)   Heart Murmur   HPI:    Emily Berg  is a 50 y.o. Caucasian female patient with family history of premature coronary artery disease in her mother in her early 29 years of age, mild aortic and mitral regurgitation by echocardiogram in 2018,  no personal risk factors and was asymptomatic presents to reestablish care.  Last seen her in 2018.  She remains asymptomatic but has gained weight since I last saw her 5 years ago.  She underwent laparoscopic hysterectomy about a month ago without any periprocedural complications.  Past Medical History:  Diagnosis Date   Anemia    Hx of anemia   Anxiety    takes prozac   Arthritis    left knee   Endometriosis 01/18/2021   Heart murmur 12/29/2016   2 mumurs f/u in 5 years saw dr Einar Gip for 12-29-2016   History of gestational diabetes 2006   History of kidney stones    PONV (postoperative nausea and vomiting)    likes scopolamine patch   Past Surgical History:  Procedure Laterality Date   CESAREAN SECTION  11/20/2005   CYSTO/  LEFT RETROGRADE PYELOGRAM/  STENT PLACEMENT  11/04/2008   left   CYSTOSCOPY N/A 01/27/2021   Procedure: CYSTOSCOPY;  Surgeon: Bobbye Charleston, MD;  Location: [facility];  Service: Gynecology;  Laterality: N/A;   CYSTOSCOPY W/ URETERAL STENT PLACEMENT Right 07/16/2014   Procedure: CYSTOSCOPY WITH RETROGRADE PYELOGRAM/URETERAL STENT PLACEMENT;  Surgeon: Bernestine Amass, MD;  Location: [facility];  Service: Urology;  Laterality: Right;   EXTRACORPOREAL SHOCK WAVE LITHOTRIPSY  07/04/2008   one 2017 right   KNEE ARTHROSCOPY Left 07/23/2018   Procedure: ARTHROSCOPY LEFT KNEE WITH CHONDROPLASTY, MANIPULATION WITH LYSIS OF ADHESIONS;  Surgeon: Hiram Gash, MD;  Location: [place];  Service: Orthopedics;  Laterality: Left;   RIGHT SUPERIOR PARATHYROIDECTOMY  02/26/2009   ROBOTIC ASSISTED TOTAL HYSTERECTOMY WITH BILATERAL SALPINGO OOPHERECTOMY Bilateral 01/27/2021   Procedure: XI ROBOTIC ASSISTED TOTAL HYSTERECTOMY WITH BILATERAL SALPINGECTOMY;  Surgeon: Bobbye Charleston, MD;  Location: [facility];  Service: Gynecology;  Laterality: Bilateral;   Family History  Problem Relation Age of Onset   Hypertension Mother    Sleep apnea Mother    Hyperlipidemia Mother    Alzheimer's disease Father 18   Diabetes Maternal Grandmother    Stroke Maternal Grandfather    Diabetes Paternal Grandmother    Heart disease Paternal Grandfather    Diabetes Paternal Grandfather    Diabetes Daughter     Social History   Tobacco Use   Smoking status: Former    Packs/day: 2.00    Years: 10.00    Pack years: 20.00    Types: Cigarettes    Quit date: 07/14/2004    Years since quitting: 16.6   Smokeless tobacco: Never  Substance Use Topics   Alcohol use: Not Currently   Marital Status: Married  ROS  Review of Systems  Cardiovascular:  Negative for chest pain, dyspnea on exertion and leg swelling.  Gastrointestinal:  Negative for melena.  Objective  Blood pressure (!) 168/98, pulse 88, temperature 98.7 F (37.1 C), temperature source Temporal, resp. rate 17, height 5' 7" (1.702 m), weight 219 lb (99.3 kg), SpO2 97 %. Body mass  index is 34.3 kg/m.  Vitals with BMI 02/22/2021 02/22/2021 02/22/2021  Height - - 5' 7"  Weight - - 219 lbs  BMI - - 01.02  Systolic 725 366 440  Diastolic 98 347 425  Pulse 88 93 90     Physical Exam Constitutional:      Appearance: She is obese.  HENT:     Head: Atraumatic.  Neck:     Vascular: No carotid bruit or JVD.  Cardiovascular:     Rate and Rhythm: Normal rate and regular rhythm.     Pulses: Intact distal pulses.     Heart sounds: S1 normal and S2 normal. Murmur heard.  Blowing early systolic murmur is  present with a grade of 2/6 at the upper right sternal border.    No gallop.  Pulmonary:     Effort: Pulmonary effort is normal.     Breath sounds: Normal breath sounds.  Abdominal:     General: Bowel sounds are normal.     Palpations: Abdomen is soft.     Laboratory examination:   No results for input(s): NA, K, CL, CO2, GLUCOSE, BUN, CREATININE, CALCIUM, GFRNONAA, GFRAA in the last 8760 hours. CrCl cannot be calculated (Patient's most recent lab result is older than the maximum 21 days allowed.).  CMP Latest Ref Rng & Units 11/18/2010 02/27/2009 02/26/2009  Glucose 70 - 99 mg/dL 70 - -  BUN 6 - 23 mg/dL 9 - -  Creatinine 0.4 - 1.2 mg/dL 1.00 - -  Sodium 135 - 145 mEq/L 141 - -  Potassium 3.5 - 5.1 mEq/L 3.8 - -  Chloride 96 - 112 mEq/L 105 - -  CO2 19 - 32 mEq/L - - -  Calcium 8.4 - 10.5 mg/dL - 8.2(L) 9.2   CBC Latest Ref Rng & Units 01/25/2021 07/16/2014 11/18/2010  WBC 4.0 - 10.5 K/uL 9.1 - -  Hemoglobin 12.0 - 15.0 g/dL 14.4 14.3 16.0(H)  Hematocrit 36.0 - 46.0 % 43.6 - 47.0(H)  Platelets 150 - 400 K/uL 308 - -    Lipid Panel No results for input(s): CHOL, TRIG, LDLCALC, VLDL, HDL, CHOLHDL, LDLDIRECT in the last 8760 hours. Lipid Panel  No results found for: CHOL, TRIG, HDL, CHOLHDL, VLDL, LDLCALC, LDLDIRECT, LABVLDL   HEMOGLOBIN A1C No results found for: HGBA1C, MPG TSH No results for input(s): TSH in the last 8760 hours.  External labs:   Labs 05/27/2020:  Hb 14.4/HCT 42.1, platelets 310.  Serum glucose 99 mg, BUN 13, creatinine 0.74, EGFR 84 mL, potassium 4.5, CMP otherwise normal.  Total cholesterol 167, triglycerides 178, HDL 46, LDL 90.  Non-HDL cholesterol 120.  Medications and allergies  No Known Allergies   Medication prior to this encounter:   Outpatient Medications Prior to Visit  Medication Sig Dispense Refill   FLUoxetine (PROZAC) 40 MG capsule Take 40 mg by mouth daily.     diphenhydrAMINE (BENADRYL) 25 MG tablet Take 25 mg by mouth every 6  (six) hours as needed.     ibuprofen (ADVIL) 200 MG tablet Take 4 tablets (800 mg total) by mouth 3 (three) times daily. 30 tablet 0   ibuprofen (ADVIL) 800 MG tablet Take 1 tablet (800 mg total) by mouth 3 (three) times daily. 30 tablet 0   norethindrone-ethinyl estradiol-FE (LOESTRIN FE) 1-20 MG-MCG tablet Take 1 tablet by mouth daily. junel     ondansetron (ZOFRAN ODT) 8 MG disintegrating tablet Take 1 tablet (8 mg total) by mouth every 8 (eight) hours as needed for  nausea or vomiting. 20 tablet 0   oxyCODONE-acetaminophen (PERCOCET/ROXICET) 5-325 MG tablet Take 1 tablet by mouth every 4 (four) hours as needed for severe pain. 30 tablet 0   No facility-administered medications prior to visit.     Medication list after today's encounter   Current Outpatient Medications  Medication Instructions   FLUoxetine (PROZAC) 40 mg, Oral, Daily   Radiology:   No results found.  Cardiac Studies:   Treadmill stress test   [10/15/2014]:  Indication: Screening for CAD, Family Hx The patient exercised according to Bruce Protocol, Total time recorded 11:29 min achieving max heart rate of 179 which was 100 % of MPHR for age and 13.48 METS of work. Normal BP response. There was no ST-T changes of ischemia with exercise stress test. Stress terminated due to THR (>85% MPHR)/MPHR met.  Carotid artery duplex 10/21/2014:  No hemodynamically significant arterial disease in the internal carotid artery bilaterally. The carotid duplex study is normal bilaterally.  Echocardiogram 08/17/2016: Left ventricle cavity is normal in size. Mild concentric hypertrophy of the left ventricle. Normal global wall motion. Normal diastolic filling pattern, normal LAP. Calculated EF 67%. Left atrial cavity is mildly dilated at 4.1 cm. Mild (Grade I) aortic regurgitation. Mild (Grade I) mitral regurgitation. Mild tricuspid regurgitation. Mild pulmonary hypertension. Pulmonary artery systolic pressure is estimated at 34 mm  Hg. IVC is dilated with respiratory variation, suggests mild elevation in central venous pressure. Operative study done on 10/16/2014, pulmonary hypertension is new.  Otherwise no significant change.  EKG:   EKG 02/22/2021: Normal sinus rhythm at rate of 81 bpm, normal axis, nonspecific T wave abnormality.    Assessment     ICD-10-CM   1. Heart murmur  R01.1 EKG 12-Lead    PCV ECHOCARDIOGRAM COMPLETE    2. Family history of premature CAD  Z82.49 CT CARDIAC SCORING ([facility] LOCATIONS ONLY)    3. Nonspecific abnormal electrocardiogram (ECG) (EKG)  R94.31 PCV ECHOCARDIOGRAM COMPLETE    4. Elevated BP without diagnosis of hypertension  R03.0        Medications Discontinued During This Encounter  Medication Reason   diphenhydrAMINE (BENADRYL) 25 MG tablet Error   ibuprofen (ADVIL) 200 MG tablet Error   ibuprofen (ADVIL) 800 MG tablet Error   norethindrone-ethinyl estradiol-FE (LOESTRIN FE) 1-20 MG-MCG tablet Error   ondansetron (ZOFRAN ODT) 8 MG disintegrating tablet Error   oxyCODONE-acetaminophen (PERCOCET/ROXICET) 5-325 MG tablet Error    No orders of the defined types were placed in this encounter.  Orders Placed This Encounter  Procedures   CT CARDIAC SCORING ([facility] LOCATIONS ONLY)    Standing Status:   Future    Standing Expiration Date:   04/24/2021    Order Specific Question:   Is patient pregnant?    Answer:   No    Order Specific Question:   Preferred imaging location?    Answer:   [facility]   EKG 12-Lead   PCV ECHOCARDIOGRAM COMPLETE    Standing Status:   Future    Standing Expiration Date:   02/22/2022   Recommendations:   KYNA BLAHNIK is a 50 y.o.  Caucasian female patient with family history of premature coronary artery disease in her mother in her early 61 years of age, mild aortic and mitral regurgitation by echocardiogram in 2018,  no personal risk factors and was asymptomatic presents to reestablish care.  Last seen her in 2018.  She remains asymptomatic but  wanted to see me for cardiac risk evaluation and also follow-up on  heart murmur.  Except for systolic ejection murmur, physical examination is unremarkable.  She has gained weight and now close to moderately obese.  Blood pressure was markedly elevated, will prefer to start her on therapy however patient would like to wait and she will do blood pressure recordings at home and let me know if >82 mmHg diastolic, she is willing to start therapy.  Weight loss will certainly help with blood pressure control but I am concerned about EKG changes and markedly elevated blood pressure that she probably has primary hypertension.  Lipids are at goal.  In view of family history, will obtain coronary calcium score.  I will see her back in 6 weeks, I will also repeat echocardiogram to follow-up on valvular heart disease and abnormal EKG.    Adrian Prows, MD, [facility] 02/22/2021, 12:07 PM Office: [phone]

## 2021-02-23 ENCOUNTER — Other Ambulatory Visit: Payer: Self-pay | Admitting: Cardiology

## 2021-02-23 DIAGNOSIS — I10 Essential (primary) hypertension: Secondary | ICD-10-CM

## 2021-02-23 MED ORDER — OLMESARTAN MEDOXOMIL-HCTZ 20-12.5 MG PO TABS: 1.0000 | ORAL_TABLET | ORAL | 3 refills | Status: DC

## 2021-02-23 NOTE — Telephone Encounter (Signed)
Dr. Einar Gip, My blood pressure this morning is 156/99.  Since you advised me to contact you if my BP was above 90 twice (including my BP at your office yesterday), I am messaging you. I am willing to go on any medication you feel is necessary to address this. Thank you for your time. Emily Berg     ICD-10-CM   1. Primary hypertension  I10 olmesartan-hydrochlorothiazide (BENICAR HCT) 20-12.5 MG tablet    Basic metabolic panel     Orders Placed This Encounter  Procedures   Basic metabolic panel    Standing Status:   Future    Standing Expiration Date:   02/23/2022   Meds ordered this encounter  Medications   olmesartan-hydrochlorothiazide (BENICAR HCT) 20-12.5 MG tablet    Sig: Take 1 tablet by mouth every morning.    Dispense:  30 tablet    Refill:  3     Adrian Prows, MD, [facility] 02/23/2021, 2:58 PM Office: [phone] Fax: [phone] Pager: [phone]

## 2021-02-23 NOTE — Telephone Encounter (Signed)
From pt

## 2021-02-24 DIAGNOSIS — Z01818 Encounter for other preprocedural examination: Secondary | ICD-10-CM | POA: Diagnosis not present

## 2021-02-26 ENCOUNTER — Ambulatory Visit: Payer: BC Managed Care – PPO

## 2021-02-26 ENCOUNTER — Other Ambulatory Visit: Payer: Self-pay

## 2021-02-26 DIAGNOSIS — R011 Cardiac murmur, unspecified: Secondary | ICD-10-CM | POA: Diagnosis not present

## 2021-02-26 DIAGNOSIS — R9431 Abnormal electrocardiogram [ECG] [EKG]: Secondary | ICD-10-CM | POA: Diagnosis not present

## 2021-03-10 DIAGNOSIS — I10 Essential (primary) hypertension: Secondary | ICD-10-CM | POA: Diagnosis not present

## 2021-03-11 ENCOUNTER — Ambulatory Visit
Admission: RE | Admit: 2021-03-11 | Discharge: 2021-03-11 | Disposition: A | Discharge status: Home / Self Care | Payer: BC Managed Care – PPO | Source: Ambulatory Visit | Attending: Cardiology | Admitting: Cardiology

## 2021-03-11 DIAGNOSIS — Z8249 Family history of ischemic heart disease and other diseases of the circulatory system: Secondary | ICD-10-CM

## 2021-03-11 LAB — BASIC METABOLIC PANEL
BUN/Creatinine Ratio: 12 (ref 9–23)
BUN: 11 mg/dL (ref 6–24)
CO2: 23 mmol/L (ref 20–29)
Calcium: 9.5 mg/dL (ref 8.7–10.2)
Chloride: 99 mmol/L (ref 96–106)
Creatinine, Ser: 0.9 mg/dL (ref 0.57–1.00)
Glucose: 132 mg/dL — ABNORMAL HIGH (ref 65–99)
Potassium: 4.2 mmol/L (ref 3.5–5.2)
Sodium: 137 mmol/L (ref 134–144)
eGFR: 78 mL/min/{1.73_m2} (ref >59)

## 2021-03-12 NOTE — Addendum Note (Signed)
Addended by: Kela Millin on: 03/12/2021 04:02 PM   Modules accepted: Orders

## 2021-03-12 NOTE — Progress Notes (Signed)
Coronary calcium score 03/12/2021: LM 0 LAD 31.3 LCx 2.2 RCA 0. Coronary calcium score of 33.5 is at the 96th percentile for the patient's age, sex and race.

## 2021-03-15 NOTE — Telephone Encounter (Signed)
From pt

## 2021-03-30 DIAGNOSIS — R931 Abnormal findings on diagnostic imaging of heart and coronary circulation: Secondary | ICD-10-CM | POA: Diagnosis not present

## 2021-03-30 DIAGNOSIS — Z8249 Family history of ischemic heart disease and other diseases of the circulatory system: Secondary | ICD-10-CM | POA: Diagnosis not present

## 2021-03-31 LAB — LIPOPROTEIN A (LPA): Lipoprotein (a): 77.9 nmol/L — ABNORMAL HIGH (ref <75.0)

## 2021-03-31 LAB — LDL CHOLESTEROL, DIRECT: LDL Direct: 108 mg/dL — ABNORMAL HIGH (ref 0–99)

## 2021-03-31 LAB — APO A1 + B + RATIO
Apolipo. B/A-1 Ratio: 0.7 ratio — ABNORMAL HIGH (ref 0.0–0.6)
Apolipoprotein A-1: 130 mg/dL (ref 116–209)
Apolipoprotein B: 94 mg/dL — ABNORMAL HIGH (ref <90)

## 2021-04-05 ENCOUNTER — Encounter: Payer: Self-pay | Admitting: Cardiology

## 2021-04-05 ENCOUNTER — Ambulatory Visit: Payer: BC Managed Care – PPO | Admitting: Cardiology

## 2021-04-05 ENCOUNTER — Other Ambulatory Visit: Payer: Self-pay

## 2021-04-05 VITALS — BP 112/78 | HR 68 | Temp 96.5°F | Resp 16 | Ht 67.0 in | Wt 204.8 lb

## 2021-04-05 DIAGNOSIS — E782 Mixed hyperlipidemia: Secondary | ICD-10-CM | POA: Diagnosis not present

## 2021-04-05 DIAGNOSIS — R931 Abnormal findings on diagnostic imaging of heart and coronary circulation: Secondary | ICD-10-CM

## 2021-04-05 DIAGNOSIS — I10 Essential (primary) hypertension: Secondary | ICD-10-CM

## 2021-04-05 MED ORDER — OLMESARTAN MEDOXOMIL-HCTZ 20-12.5 MG PO TABS: 1.0000 | ORAL_TABLET | ORAL | 3 refills | Status: DC

## 2021-04-05 MED ORDER — ROSUVASTATIN CALCIUM 20 MG PO TABS: 20.0000 mg | ORAL_TABLET | Freq: Every day | ORAL | 2 refills | Status: DC

## 2021-04-05 NOTE — Progress Notes (Signed)
Primary Physician/Referring:  Donald Prose, MD  Patient ID: Emily Berg, female    DOB: June 29, 1971, 50 y.o.   MRN: [id number]  Chief Complaint  Patient presents with   Hypertension   Abnormal ECG   Follow-up    6 weeks   HPI:    LANIESHA DAS  is a 50 y.o. Caucasian female patient with family history of premature coronary artery disease in her mother in her early 5 years of age, mild aortic and mitral regurgitation by echocardiogram in 2018,  no personal risk factors and was asymptomatic presents for follow-up of hypertension, hyperlipidemia.  Remains asymptomatic.  Past Medical History:  Diagnosis Date   Anemia    Hx of anemia   Anxiety    takes prozac   Arthritis    left knee   Endometriosis 01/18/2021   Heart murmur 12/29/2016   2 mumurs f/u in 5 years saw dr Einar Gip for 12-29-2016   History of gestational diabetes 2006   History of kidney stones    PONV (postoperative nausea and vomiting)    likes scopolamine patch   Past Surgical History:  Procedure Laterality Date   CESAREAN SECTION  11/20/2005   CYSTO/  LEFT RETROGRADE PYELOGRAM/  STENT PLACEMENT  11/04/2008   left   CYSTOSCOPY N/A 01/27/2021   Procedure: CYSTOSCOPY;  Surgeon: Bobbye Charleston, MD;  Location: [facility];  Service: Gynecology;  Laterality: N/A;   CYSTOSCOPY W/ URETERAL STENT PLACEMENT Right 07/16/2014   Procedure: CYSTOSCOPY WITH RETROGRADE PYELOGRAM/URETERAL STENT PLACEMENT;  Surgeon: Bernestine Amass, MD;  Location: [facility];  Service: Urology;  Laterality: Right;   EXTRACORPOREAL SHOCK WAVE LITHOTRIPSY  07/04/2008   one 2017 right   KNEE ARTHROSCOPY Left 07/23/2018   Procedure: ARTHROSCOPY LEFT KNEE WITH CHONDROPLASTY, MANIPULATION WITH LYSIS OF ADHESIONS;  Surgeon: Hiram Gash, MD;  Location: [place];  Service: Orthopedics;  Laterality: Left;   RIGHT SUPERIOR PARATHYROIDECTOMY  02/26/2009   ROBOTIC ASSISTED TOTAL HYSTERECTOMY WITH  BILATERAL SALPINGO OOPHERECTOMY Bilateral 01/27/2021   Procedure: XI ROBOTIC ASSISTED TOTAL HYSTERECTOMY WITH BILATERAL SALPINGECTOMY;  Surgeon: Bobbye Charleston, MD;  Location: [place];  Service: Gynecology;  Laterality: Bilateral;   Family History  Problem Relation Age of Onset   Hypertension Mother    Sleep apnea Mother    Hyperlipidemia Mother    Alzheimer's disease Father 75   Diabetes Maternal Grandmother    Stroke Maternal Grandfather    Diabetes Paternal Grandmother    Heart disease Paternal Grandfather    Diabetes Paternal Grandfather    Diabetes Daughter     Social History   Tobacco Use   Smoking status: Former    Packs/day: 2.00    Years: 10.00    Pack years: 20.00    Types: Cigarettes    Quit date: 07/14/2004    Years since quitting: 16.7   Smokeless tobacco: Never  Substance Use Topics   Alcohol use: Not Currently   Marital Status: Married  ROS  Review of Systems  Cardiovascular:  Negative for chest pain, dyspnea on exertion and leg swelling.  Gastrointestinal:  Negative for melena.  Objective  Blood pressure 112/78, pulse 68, temperature (!) 96.5 F (35.8 C), temperature source Temporal, resp. rate 16, height '5\' 7"'  (1.702 m), weight 204 lb 12.8 oz (92.9 kg), SpO2 98 %. Body mass index is 32.08 kg/m.  Vitals with BMI 04/05/2021 02/22/2021 02/22/2021  Height '5\' 7"'  - -  Weight 204 lbs 13 oz - -  BMI 63.84 - -  Systolic 536 468 032  Diastolic 78 98 122  Pulse 68 88 93     Physical Exam Constitutional:      Appearance: She is obese.  HENT:     Head: Atraumatic.  Neck:     Vascular: No carotid bruit or JVD.  Cardiovascular:     Rate and Rhythm: Normal rate and regular rhythm.     Pulses: Intact distal pulses.     Heart sounds: S1 normal and S2 normal. Murmur heard.  Blowing early systolic murmur is present with a grade of 2/6 at the upper right sternal border.    No gallop.  Pulmonary:     Effort: Pulmonary effort is normal.      Breath sounds: Normal breath sounds.  Abdominal:     General: Bowel sounds are normal.     Palpations: Abdomen is soft.     Laboratory examination:   Recent Labs    03/10/21 1110  NA 137  K 4.2  CL 99  CO2 23  GLUCOSE 132*  BUN 11  CREATININE 0.90  CALCIUM 9.5   CrCl cannot be calculated (Patient's most recent lab result is older than the maximum 21 days allowed.).  CMP Latest Ref Rng & Units 03/10/2021 11/18/2010 02/27/2009  Glucose 65 - 99 mg/dL 132(H) 70 -  BUN 6 - 24 mg/dL 11 9 -  Creatinine 0.57 - 1.00 mg/dL 0.90 1.00 -  Sodium 134 - 144 mmol/L 137 141 -  Potassium 3.5 - 5.2 mmol/L 4.2 3.8 -  Chloride 96 - 106 mmol/L 99 105 -  CO2 20 - 29 mmol/L 23 - -  Calcium 8.7 - 10.2 mg/dL 9.5 - 8.2(L)   CBC Latest Ref Rng & Units 01/25/2021 07/16/2014 11/18/2010  WBC 4.0 - 10.5 K/uL 9.1 - -  Hemoglobin 12.0 - 15.0 g/dL 14.4 14.3 16.0(H)  Hematocrit 36.0 - 46.0 % 43.6 - 47.0(H)  Platelets 150 - 400 K/uL 308 - -    Lipid Panel Recent Labs    03/30/21 0757  LDLDIRECT 108*   Lipid Panel     Component Value Date/Time   LDLDIRECT 108 (H) 03/30/2021 0757    Component Ref Range & Units 03/31/2021:  Lipoprotein (a) <75.0 nmol/L 77.9 High   Apolipo. B/A-1 Ratio 0.0 - 0.6 ratio 0.7 High      HEMOGLOBIN A1C No results found for: HGBA1C, MPG TSH No results for input(s): TSH in the last 8760 hours.  External labs:   Labs 05/27/2020:  Hb 14.4/HCT 42.1, platelets 310.  Serum glucose 99 mg, BUN 13, creatinine 0.74, EGFR 84 mL, potassium 4.5, CMP otherwise normal.  Total cholesterol 167, triglycerides 178, HDL 46, LDL 90.  Non-HDL cholesterol 120.  Medications and allergies  No Known Allergies   Medication prior to this encounter:   Outpatient Medications Prior to Visit  Medication Sig Dispense Refill   FLUoxetine (PROZAC) 40 MG capsule Take 40 mg by mouth daily.     olmesartan-hydrochlorothiazide (BENICAR HCT) 20-12.5 MG tablet Take 1 tablet by mouth every morning. 30  tablet 3   No facility-administered medications prior to visit.     Medication list after today's encounter   Current Outpatient Medications  Medication Instructions   FLUoxetine (PROZAC) 40 mg, Oral, Daily   olmesartan-hydrochlorothiazide (BENICAR HCT) 20-12.5 MG tablet 1 tablet, Oral, BH-each morning   rosuvastatin (CRESTOR) 20 mg, Oral, Daily   Radiology:   No results found.  Cardiac Studies:   Treadmill stress test   [  10/15/2014]:  Indication: Screening for CAD, Family Hx The patient exercised according to Bruce Protocol, Total time recorded 11:29 min achieving max heart rate of 179 which was 100 % of MPHR for age and 13.48 METS of work. Normal BP response. There was no ST-T changes of ischemia with exercise stress test. Stress terminated due to THR (>85% MPHR)/MPHR met.  Carotid artery duplex 10/21/2014:  No hemodynamically significant arterial disease in the internal carotid artery bilaterally. The carotid duplex study is normal bilaterally.  PCV ECHOCARDIOGRAM COMPLETE 02/26/2021  Narrative Echocardiogram 02/26/2021: Normal LV systolic function with visual EF 60-65%. Left ventricle cavity is normal in size. Basal septal hypertrophy with mild left ventricular hypertrophy. Normal global wall motion. Normal diastolic filling pattern, normal LAP. Mild (Grade I) mitral regurgitation. Compared to study 08/17/2016 mild TR and AR is now resolved otherwise no significant change.  Coronary calcium score 03/12/2021: LM 0 LAD 31.3 LCx 2.2 RCA 0. Coronary calcium score of 33.5 is at the 96th percentile for the patient's age, sex and race.     EKG:   EKG 02/22/2021: Normal sinus rhythm at rate of 81 bpm, normal axis, nonspecific T wave abnormality.    Assessment     ICD-10-CM   1. Elevated coronary artery calcium score: 33.5 is at the 96th percentile 03/12/2021.  R93.1     2. Primary hypertension  I10 olmesartan-hydrochlorothiazide (BENICAR HCT) 20-12.5 MG tablet    3. Mixed  hyperlipidemia  E78.2 rosuvastatin (CRESTOR) 20 MG tablet    Lipid Panel With LDL/HDL Ratio    Lipid Panel With LDL/HDL Ratio        Medications Discontinued During This Encounter  Medication Reason   olmesartan-hydrochlorothiazide (BENICAR HCT) 20-12.5 MG tablet Reorder     Meds ordered this encounter  Medications   rosuvastatin (CRESTOR) 20 MG tablet    Sig: Take 1 tablet (20 mg total) by mouth daily.    Dispense:  30 tablet    Refill:  2   olmesartan-hydrochlorothiazide (BENICAR HCT) 20-12.5 MG tablet    Sig: Take 1 tablet by mouth every morning.    Dispense:  90 tablet    Refill:  3    Orders Placed This Encounter  Procedures   Lipid Panel With LDL/HDL Ratio    Standing Status:   Future    Number of Occurrences:   1    Standing Expiration Date:   04/05/2022    Recommendations:   ERSA DELANEY is a 50 y.o.  Caucasian female patient with family history of premature coronary artery disease in her mother in her early 40 years of age, no personal risk factors and was asymptomatic presents for follow-up of hypertension and hyperlipidemia.  I reviewed the results of the labs including LPA which is elevated and also APO B-A1 ratio, direct LDL.  In view of markedly elevated coronary calcium score percentile, I started her on Crestor 20 mg daily, will recheck lipids in 2 months.  She is already lost about 14 pounds in weight, diet and exercise extensively discussed.  Blood pressure is now well controlled on Benicar HCT, continue the same.  I would like to see her back on an annual basis for now until she is stable then as needed.  Echocardiogram discussed, very mild mitral regurgitation.  No change in physical exam.   Adrian Prows, MD, [facility] 04/05/2021, 9:35 AM Office: [phone]

## 2021-05-03 DIAGNOSIS — D2372 Other benign neoplasm of skin of left lower limb, including hip: Secondary | ICD-10-CM | POA: Diagnosis not present

## 2021-05-03 DIAGNOSIS — L814 Other melanin hyperpigmentation: Secondary | ICD-10-CM | POA: Diagnosis not present

## 2021-05-03 DIAGNOSIS — D485 Neoplasm of uncertain behavior of skin: Secondary | ICD-10-CM | POA: Diagnosis not present

## 2021-05-03 DIAGNOSIS — L538 Other specified erythematous conditions: Secondary | ICD-10-CM | POA: Diagnosis not present

## 2021-05-03 DIAGNOSIS — L821 Other seborrheic keratosis: Secondary | ICD-10-CM | POA: Diagnosis not present

## 2021-05-03 DIAGNOSIS — D225 Melanocytic nevi of trunk: Secondary | ICD-10-CM | POA: Diagnosis not present

## 2021-05-26 DIAGNOSIS — Z1211 Encounter for screening for malignant neoplasm of colon: Secondary | ICD-10-CM | POA: Diagnosis not present

## 2021-05-26 DIAGNOSIS — D124 Benign neoplasm of descending colon: Secondary | ICD-10-CM | POA: Diagnosis not present

## 2021-05-26 DIAGNOSIS — K5730 Diverticulosis of large intestine without perforation or abscess without bleeding: Secondary | ICD-10-CM | POA: Diagnosis not present

## 2021-05-31 ENCOUNTER — Encounter: Payer: Self-pay | Admitting: Cardiology

## 2021-05-31 DIAGNOSIS — F411 Generalized anxiety disorder: Secondary | ICD-10-CM | POA: Diagnosis not present

## 2021-05-31 DIAGNOSIS — Z0000 Encounter for general adult medical examination without abnormal findings: Secondary | ICD-10-CM | POA: Diagnosis not present

## 2021-05-31 DIAGNOSIS — I10 Essential (primary) hypertension: Secondary | ICD-10-CM | POA: Diagnosis not present

## 2021-05-31 DIAGNOSIS — E785 Hyperlipidemia, unspecified: Secondary | ICD-10-CM | POA: Diagnosis not present

## 2021-05-31 DIAGNOSIS — E6609 Other obesity due to excess calories: Secondary | ICD-10-CM | POA: Diagnosis not present

## 2021-05-31 NOTE — Telephone Encounter (Signed)
From pt

## 2021-06-29 ENCOUNTER — Other Ambulatory Visit: Payer: Self-pay | Admitting: Cardiology

## 2021-06-29 DIAGNOSIS — E782 Mixed hyperlipidemia: Secondary | ICD-10-CM

## 2021-08-02 DIAGNOSIS — D225 Melanocytic nevi of trunk: Secondary | ICD-10-CM | POA: Diagnosis not present

## 2021-08-02 DIAGNOSIS — L814 Other melanin hyperpigmentation: Secondary | ICD-10-CM | POA: Diagnosis not present

## 2021-08-02 DIAGNOSIS — Z872 Personal history of diseases of the skin and subcutaneous tissue: Secondary | ICD-10-CM | POA: Diagnosis not present

## 2021-11-11 DIAGNOSIS — Z1272 Encounter for screening for malignant neoplasm of vagina: Secondary | ICD-10-CM | POA: Diagnosis not present

## 2021-11-11 DIAGNOSIS — Z1231 Encounter for screening mammogram for malignant neoplasm of breast: Secondary | ICD-10-CM | POA: Diagnosis not present

## 2021-11-11 DIAGNOSIS — Z01419 Encounter for gynecological examination (general) (routine) without abnormal findings: Secondary | ICD-10-CM | POA: Diagnosis not present

## 2021-11-11 DIAGNOSIS — Z124 Encounter for screening for malignant neoplasm of cervix: Secondary | ICD-10-CM | POA: Diagnosis not present

## 2021-12-01 DIAGNOSIS — R400 Somnolence: Secondary | ICD-10-CM | POA: Diagnosis not present

## 2021-12-01 DIAGNOSIS — F411 Generalized anxiety disorder: Secondary | ICD-10-CM | POA: Diagnosis not present

## 2022-01-28 DIAGNOSIS — G4719 Other hypersomnia: Secondary | ICD-10-CM | POA: Diagnosis not present

## 2022-01-28 DIAGNOSIS — I10 Essential (primary) hypertension: Secondary | ICD-10-CM | POA: Diagnosis not present

## 2022-02-21 DIAGNOSIS — I10 Essential (primary) hypertension: Secondary | ICD-10-CM | POA: Diagnosis not present

## 2022-02-21 DIAGNOSIS — G4733 Obstructive sleep apnea (adult) (pediatric): Secondary | ICD-10-CM | POA: Diagnosis not present

## 2022-03-04 DIAGNOSIS — G4733 Obstructive sleep apnea (adult) (pediatric): Secondary | ICD-10-CM | POA: Diagnosis not present

## 2022-03-04 DIAGNOSIS — G4719 Other hypersomnia: Secondary | ICD-10-CM | POA: Diagnosis not present

## 2022-04-03 DIAGNOSIS — G4733 Obstructive sleep apnea (adult) (pediatric): Secondary | ICD-10-CM | POA: Diagnosis not present

## 2022-04-03 DIAGNOSIS — G4719 Other hypersomnia: Secondary | ICD-10-CM | POA: Diagnosis not present

## 2022-04-06 ENCOUNTER — Ambulatory Visit: Payer: BC Managed Care – PPO | Admitting: Cardiology

## 2022-04-06 ENCOUNTER — Encounter: Payer: Self-pay | Admitting: Cardiology

## 2022-04-06 VITALS — BP 115/73 | HR 65 | Temp 98.2°F | Resp 16 | Ht 67.0 in | Wt 196.0 lb

## 2022-04-06 DIAGNOSIS — R931 Abnormal findings on diagnostic imaging of heart and coronary circulation: Secondary | ICD-10-CM | POA: Diagnosis not present

## 2022-04-06 DIAGNOSIS — I10 Essential (primary) hypertension: Secondary | ICD-10-CM | POA: Diagnosis not present

## 2022-04-06 DIAGNOSIS — E782 Mixed hyperlipidemia: Secondary | ICD-10-CM

## 2022-04-06 NOTE — Progress Notes (Signed)
Primary Physician/Referring:  Donald Prose, MD  Patient ID: Emily Berg, female    DOB: 22-Apr-1971, 51 y.o.   MRN: [id number]  Chief Complaint  Patient presents with    Elevated coronary calcium score   Hypertension   Hyperlipidemia   Follow-up   HPI:    Emily Berg  is a 51 y.o. Caucasian female patient with family history of premature coronary artery disease in her mother in her early 44 years of age, no other personal risk factors and was asymptomatic presents for follow-up of hypertension and hyperlipidemia. Remains asymptomatic.  Past Medical History:  Diagnosis Date   Anemia    Hx of anemia   Anxiety    takes prozac   Arthritis    left knee   Endometriosis 01/18/2021   Heart murmur 12/29/2016   2 mumurs f/u in 5 years saw dr Einar Gip for 12-29-2016   History of gestational diabetes 2006   History of kidney stones    PONV (postoperative nausea and vomiting)    likes scopolamine patch   Sleep apnea    Past Surgical History:  Procedure Laterality Date   CESAREAN SECTION  11/20/2005   CYSTO/  LEFT RETROGRADE PYELOGRAM/  STENT PLACEMENT  11/04/2008   left   CYSTOSCOPY N/A 01/27/2021   Procedure: CYSTOSCOPY;  Surgeon: Bobbye Charleston, MD;  Location: [facility];  Service: Gynecology;  Laterality: N/A;   CYSTOSCOPY W/ URETERAL STENT PLACEMENT Right 07/16/2014   Procedure: CYSTOSCOPY WITH RETROGRADE PYELOGRAM/URETERAL STENT PLACEMENT;  Surgeon: Bernestine Amass, MD;  Location: [facility];  Service: Urology;  Laterality: Right;   EXTRACORPOREAL SHOCK WAVE LITHOTRIPSY  07/04/2008   one 2017 right   KNEE ARTHROSCOPY Left 07/23/2018   Procedure: ARTHROSCOPY LEFT KNEE WITH CHONDROPLASTY, MANIPULATION WITH LYSIS OF ADHESIONS;  Surgeon: Hiram Gash, MD;  Location: [facility];  Service: Orthopedics;  Laterality: Left;   RIGHT SUPERIOR PARATHYROIDECTOMY  02/26/2009   ROBOTIC ASSISTED TOTAL HYSTERECTOMY WITH BILATERAL  SALPINGO OOPHERECTOMY Bilateral 01/27/2021   Procedure: XI ROBOTIC ASSISTED TOTAL HYSTERECTOMY WITH BILATERAL SALPINGECTOMY;  Surgeon: Bobbye Charleston, MD;  Location: [facility];  Service: Gynecology;  Laterality: Bilateral;   Family History  Problem Relation Age of Onset   Hypertension Mother    Sleep apnea Mother    Hyperlipidemia Mother    Alzheimer's disease Father 38   Diabetes Maternal Grandmother    Stroke Maternal Grandfather    Diabetes Paternal Grandmother    Heart disease Paternal Grandfather    Diabetes Paternal Grandfather    Diabetes Daughter     Social History   Tobacco Use   Smoking status: Former    Packs/day: 2.00    Years: 10.00    Total pack years: 20.00    Types: Cigarettes    Quit date: 07/14/2004    Years since quitting: 17.7   Smokeless tobacco: Never  Substance Use Topics   Alcohol use: Not Currently   Marital Status: Married  ROS  Review of Systems  Cardiovascular:  Negative for chest pain, dyspnea on exertion and leg swelling.  Gastrointestinal:  Negative for melena.   Objective  Blood pressure 115/73, pulse 65, temperature 98.2 F (36.8 C), temperature source Temporal, resp. rate 16, height '5\' 7"'  (1.702 m), weight 196 lb (88.9 kg), SpO2 96 %. Body mass index is 30.7 kg/m.     04/06/2022    8:57 AM 04/05/2021    9:10 AM 02/22/2021   12:03 PM  Vitals  with BMI  Height '5\' 7"'  '5\' 7"'    Weight 196 lbs 204 lbs 13 oz   BMI 00.37 04.88   Systolic 891 694 503  Diastolic 73 78 98  Pulse 65 68 88     Physical Exam Neck:     Vascular: No carotid bruit or JVD.  Cardiovascular:     Rate and Rhythm: Normal rate and regular rhythm.     Pulses: Intact distal pulses.     Heart sounds: Normal heart sounds. No murmur heard.    No gallop.  Pulmonary:     Effort: Pulmonary effort is normal.     Breath sounds: Normal breath sounds.  Abdominal:     General: Bowel sounds are normal.     Palpations: Abdomen is soft.  Musculoskeletal:      Right lower leg: No edema.     Left lower leg: No edema.      Laboratory examination:       Component Value Date/Time   LDLDIRECT 108 (H) 03/30/2021 0757    Component Ref Range & Units 03/31/2021:  Lipoprotein (a) <75.0 nmol/L 77.9 High   Apolipo. B/A-1 Ratio 0.0 - 0.6 ratio 0.7 High     External labs:   Labs 05/27/2020:  Hb 14.4/HCT 42.1, platelets 310.  Serum glucose 99 mg, BUN 13, creatinine 0.74, EGFR 84 mL, potassium 4.5, CMP otherwise normal.  Total cholesterol 167, triglycerides 178, HDL 46, LDL 90.  Non-HDL cholesterol 120.  Medications and allergies  No Known Allergies   Medication list   Current Outpatient Medications  Medication Instructions   Coenzyme Q10 100 mg, Oral, Daily   FLUoxetine (PROZAC) 40 mg, Oral, Daily   Multiple Vitamin (MULTIVITAMIN) tablet 1 tablet, Oral, Daily   NON FORMULARY 1 capsule, Oral, Daily, Nature Made Triple Flex Glucosamine 1500 mg Chondroitin 800 mg MSM 750 mg Vitamin D3 2000 UI    olmesartan-hydrochlorothiazide (BENICAR HCT) 20-12.5 MG tablet 1 tablet, Oral, BH-each morning   rosuvastatin (CRESTOR) 20 MG tablet TAKE 1 TABLET BY MOUTH EVERY DAY   Radiology:   No results found.  Cardiac Studies:   Treadmill stress test   [10/15/2014]:  Indication: Screening for CAD, Family Hx The patient exercised according to Bruce Protocol, Total time recorded 11:29 min achieving max heart rate of 179 which was 100 % of MPHR for age and 13.48 METS of work. Normal BP response. There was no ST-T changes of ischemia with exercise stress test. Stress terminated due to THR (>85% MPHR)/MPHR met.  Carotid artery duplex 10/21/2014:  No hemodynamically significant arterial disease in the internal carotid artery bilaterally. The carotid duplex study is normal bilaterally.  PCV ECHOCARDIOGRAM COMPLETE 02/26/2021  Narrative Echocardiogram 02/26/2021: Normal LV systolic function with visual EF 60-65%. Left ventricle cavity is normal in size. Basal  septal hypertrophy with mild left ventricular hypertrophy. Normal global wall motion. Normal diastolic filling pattern, normal LAP. Mild (Grade I) mitral regurgitation. Compared to study 08/17/2016 mild TR and AR is now resolved otherwise no significant change.  Coronary calcium score 03/12/2021: LM 0 LAD 31.3 LCx 2.2 RCA 0. Coronary calcium score of 33.5 is at the 96th percentile for the patient's age, sex and race.     EKG:   EKG 02/22/2021: Normal sinus rhythm at rate of 81 bpm, normal axis, nonspecific T wave abnormality.    Assessment     ICD-10-CM   1. Elevated coronary artery calcium score 03/12/2021: 33.5 is at the 96th percentile  R93.1 EKG 12-Lead  2. Mixed hyperlipidemia  E78.2 Lipid Panel With LDL/HDL Ratio    3. Primary hypertension  I10 CMP14+EGFR    TSH    High sensitivity CRP      Medications Discontinued During This Encounter  Medication Reason   glucosamine-chondroitin 500-400 MG tablet      No orders of the defined types were placed in this encounter.   Orders Placed This Encounter  Procedures   Lipid Panel With LDL/HDL Ratio   CMP14+EGFR   TSH   High sensitivity CRP   EKG 12-Lead    Recommendations:   ARIANE DITULLIO is a 51 y.o.  Caucasian female patient with family history of premature coronary artery disease in her mother in her early 59 years of age, no other personal risk factors and was asymptomatic presents for follow-up of hypertension and hyperlipidemia.  She has elevated LP(a) and also APO B-A1 ratio, direct LDL.  In view of markedly elevated coronary calcium score in 90+ percentile, I started her on Crestor 20 mg daily, she has had myalgias and she is presently on 10 mg daily.  We will repeat lipid profile testing and if necessary will add Zetia.  Since last office visit a year ago, she has lost additional 10 pounds in weight, overall a 14 pound weight loss since she started seeing [place].  She is presently doing intermittent fast,  suspect if she continues to lose weight, we may have to reduce the dose of the Benicar HCT to plain Benicar.  Otherwise physical examination is unremarkable.  EKG is normal.  I was going to see her back on a as needed basis, but on patient's request I will see her back in 2 years.      Adrian Prows, MD, [facility] 04/06/2022, 9:22 AM Office: [phone]

## 2022-05-04 ENCOUNTER — Other Ambulatory Visit: Payer: Self-pay | Admitting: Cardiology

## 2022-05-04 DIAGNOSIS — I10 Essential (primary) hypertension: Secondary | ICD-10-CM

## 2022-05-04 DIAGNOSIS — G4719 Other hypersomnia: Secondary | ICD-10-CM | POA: Diagnosis not present

## 2022-05-04 DIAGNOSIS — G4733 Obstructive sleep apnea (adult) (pediatric): Secondary | ICD-10-CM | POA: Diagnosis not present

## 2022-05-30 DIAGNOSIS — E669 Obesity, unspecified: Secondary | ICD-10-CM | POA: Diagnosis not present

## 2022-05-30 DIAGNOSIS — G4733 Obstructive sleep apnea (adult) (pediatric): Secondary | ICD-10-CM | POA: Diagnosis not present

## 2022-05-30 DIAGNOSIS — I10 Essential (primary) hypertension: Secondary | ICD-10-CM | POA: Diagnosis not present

## 2022-06-03 DIAGNOSIS — G4733 Obstructive sleep apnea (adult) (pediatric): Secondary | ICD-10-CM | POA: Diagnosis not present

## 2022-06-03 DIAGNOSIS — G4719 Other hypersomnia: Secondary | ICD-10-CM | POA: Diagnosis not present

## 2022-06-14 DIAGNOSIS — E785 Hyperlipidemia, unspecified: Secondary | ICD-10-CM | POA: Diagnosis not present

## 2022-06-14 DIAGNOSIS — F411 Generalized anxiety disorder: Secondary | ICD-10-CM | POA: Diagnosis not present

## 2022-06-14 DIAGNOSIS — Z0000 Encounter for general adult medical examination without abnormal findings: Secondary | ICD-10-CM | POA: Diagnosis not present

## 2022-06-14 DIAGNOSIS — E6609 Other obesity due to excess calories: Secondary | ICD-10-CM | POA: Diagnosis not present

## 2022-06-21 DIAGNOSIS — I10 Essential (primary) hypertension: Secondary | ICD-10-CM | POA: Diagnosis not present

## 2022-06-21 DIAGNOSIS — M25561 Pain in right knee: Secondary | ICD-10-CM | POA: Diagnosis not present

## 2022-06-21 DIAGNOSIS — E782 Mixed hyperlipidemia: Secondary | ICD-10-CM | POA: Diagnosis not present

## 2022-06-22 LAB — CMP14+EGFR
ALT: 15 IU/L (ref 0–32)
AST: 16 IU/L (ref 0–40)
Albumin/Globulin Ratio: 1.6 (ref 1.2–2.2)
Albumin: 4.1 g/dL (ref 3.9–4.9)
Alkaline Phosphatase: 133 IU/L — ABNORMAL HIGH (ref 44–121)
BUN/Creatinine Ratio: 17 (ref 9–23)
BUN: 15 mg/dL (ref 6–24)
Bilirubin Total: 0.4 mg/dL (ref 0.0–1.2)
CO2: 25 mmol/L (ref 20–29)
Calcium: 9.3 mg/dL (ref 8.7–10.2)
Chloride: 103 mmol/L (ref 96–106)
Creatinine, Ser: 0.89 mg/dL (ref 0.57–1.00)
Globulin, Total: 2.6 g/dL (ref 1.5–4.5)
Glucose: 121 mg/dL — ABNORMAL HIGH (ref 70–99)
Potassium: 4.4 mmol/L (ref 3.5–5.2)
Sodium: 142 mmol/L (ref 134–144)
Total Protein: 6.7 g/dL (ref 6.0–8.5)
eGFR: 79 mL/min/{1.73_m2} (ref >59)

## 2022-06-22 LAB — HIGH SENSITIVITY CRP: CRP, High Sensitivity: 1.72 mg/L (ref 0.00–3.00)

## 2022-06-22 LAB — TSH: TSH: 1.68 u[IU]/mL (ref 0.450–4.500)

## 2022-06-22 LAB — LIPID PANEL WITH LDL/HDL RATIO
Cholesterol, Total: 140 mg/dL (ref 100–199)
HDL: 59 mg/dL (ref >39)
LDL Chol Calc (NIH): 66 mg/dL (ref 0–99)
LDL/HDL Ratio: 1.1 ratio (ref 0.0–3.2)
Triglycerides: 75 mg/dL (ref 0–149)
VLDL Cholesterol Cal: 15 mg/dL (ref 5–40)

## 2022-06-25 ENCOUNTER — Other Ambulatory Visit: Payer: Self-pay | Admitting: Cardiology

## 2022-06-25 DIAGNOSIS — E782 Mixed hyperlipidemia: Secondary | ICD-10-CM

## 2022-07-08 DIAGNOSIS — M25561 Pain in right knee: Secondary | ICD-10-CM | POA: Diagnosis not present

## 2022-07-19 DIAGNOSIS — M25561 Pain in right knee: Secondary | ICD-10-CM | POA: Diagnosis not present

## 2022-07-22 DIAGNOSIS — M6281 Muscle weakness (generalized): Secondary | ICD-10-CM | POA: Diagnosis not present

## 2022-07-22 DIAGNOSIS — R269 Unspecified abnormalities of gait and mobility: Secondary | ICD-10-CM | POA: Diagnosis not present

## 2022-07-22 DIAGNOSIS — M1711 Unilateral primary osteoarthritis, right knee: Secondary | ICD-10-CM | POA: Diagnosis not present

## 2022-08-05 DIAGNOSIS — M1711 Unilateral primary osteoarthritis, right knee: Secondary | ICD-10-CM | POA: Diagnosis not present

## 2022-08-05 DIAGNOSIS — R269 Unspecified abnormalities of gait and mobility: Secondary | ICD-10-CM | POA: Diagnosis not present

## 2022-08-05 DIAGNOSIS — M6281 Muscle weakness (generalized): Secondary | ICD-10-CM | POA: Diagnosis not present

## 2022-08-19 DIAGNOSIS — M6281 Muscle weakness (generalized): Secondary | ICD-10-CM | POA: Diagnosis not present

## 2022-08-19 DIAGNOSIS — M1711 Unilateral primary osteoarthritis, right knee: Secondary | ICD-10-CM | POA: Diagnosis not present

## 2022-08-19 DIAGNOSIS — R269 Unspecified abnormalities of gait and mobility: Secondary | ICD-10-CM | POA: Diagnosis not present

## 2022-08-31 DIAGNOSIS — M6281 Muscle weakness (generalized): Secondary | ICD-10-CM | POA: Diagnosis not present

## 2022-08-31 DIAGNOSIS — M1711 Unilateral primary osteoarthritis, right knee: Secondary | ICD-10-CM | POA: Diagnosis not present

## 2022-08-31 DIAGNOSIS — R269 Unspecified abnormalities of gait and mobility: Secondary | ICD-10-CM | POA: Diagnosis not present

## 2022-09-01 DIAGNOSIS — E669 Obesity, unspecified: Secondary | ICD-10-CM | POA: Diagnosis not present

## 2022-09-01 DIAGNOSIS — G4733 Obstructive sleep apnea (adult) (pediatric): Secondary | ICD-10-CM | POA: Diagnosis not present

## 2022-09-01 DIAGNOSIS — I10 Essential (primary) hypertension: Secondary | ICD-10-CM | POA: Diagnosis not present

## 2022-09-14 DIAGNOSIS — M6281 Muscle weakness (generalized): Secondary | ICD-10-CM | POA: Diagnosis not present

## 2022-09-14 DIAGNOSIS — M1711 Unilateral primary osteoarthritis, right knee: Secondary | ICD-10-CM | POA: Diagnosis not present

## 2022-09-14 DIAGNOSIS — R269 Unspecified abnormalities of gait and mobility: Secondary | ICD-10-CM | POA: Diagnosis not present

## 2023-01-15 ENCOUNTER — Other Ambulatory Visit: Payer: Self-pay | Admitting: Cardiology

## 2023-01-15 DIAGNOSIS — I10 Essential (primary) hypertension: Secondary | ICD-10-CM

## 2023-01-24 ENCOUNTER — Other Ambulatory Visit: Payer: Self-pay

## 2023-01-24 ENCOUNTER — Telehealth: Payer: Self-pay | Admitting: Cardiology

## 2023-01-24 DIAGNOSIS — I10 Essential (primary) hypertension: Secondary | ICD-10-CM

## 2023-01-24 MED ORDER — OLMESARTAN MEDOXOMIL-HCTZ 20-12.5 MG PO TABS: 1.0000 | ORAL_TABLET | Freq: Every day | ORAL | 1 refills | Status: DC

## 2023-01-24 MED ORDER — OLMESARTAN MEDOXOMIL-HCTZ 20-12.5 MG PO TABS: 1.0000 | ORAL_TABLET | Freq: Every day | ORAL | 3 refills | Status: AC

## 2023-02-08 NOTE — Telephone Encounter (Signed)
Done

## 2023-07-18 DIAGNOSIS — Z0000 Encounter for general adult medical examination without abnormal findings: Secondary | ICD-10-CM | POA: Diagnosis not present

## 2023-07-18 DIAGNOSIS — F411 Generalized anxiety disorder: Secondary | ICD-10-CM | POA: Diagnosis not present

## 2023-07-18 DIAGNOSIS — E6609 Other obesity due to excess calories: Secondary | ICD-10-CM | POA: Diagnosis not present

## 2023-07-18 DIAGNOSIS — E785 Hyperlipidemia, unspecified: Secondary | ICD-10-CM | POA: Diagnosis not present

## 2023-07-18 DIAGNOSIS — I10 Essential (primary) hypertension: Secondary | ICD-10-CM | POA: Diagnosis not present

## 2023-07-19 DIAGNOSIS — Z0000 Encounter for general adult medical examination without abnormal findings: Secondary | ICD-10-CM | POA: Diagnosis not present

## 2023-10-12 DIAGNOSIS — G4733 Obstructive sleep apnea (adult) (pediatric): Secondary | ICD-10-CM | POA: Diagnosis not present

## 2024-01-15 DIAGNOSIS — F411 Generalized anxiety disorder: Secondary | ICD-10-CM | POA: Diagnosis not present

## 2024-02-16 DIAGNOSIS — T63461A Toxic effect of venom of wasps, accidental (unintentional), initial encounter: Secondary | ICD-10-CM | POA: Diagnosis not present

## 2024-02-16 DIAGNOSIS — Z6833 Body mass index (BMI) 33.0-33.9, adult: Secondary | ICD-10-CM | POA: Diagnosis not present
